# Patient Record
Sex: Male | Born: 1940 | Race: Asian | Hispanic: No | Marital: Married | Smoking: Never smoker
Health system: Southern US, Community
[De-identification: ages and names within clinical notes are randomized; demographics above are authoritative.]

## PROBLEM LIST (undated history)

## (undated) DIAGNOSIS — J449 Chronic obstructive pulmonary disease, unspecified: Secondary | ICD-10-CM

## (undated) DIAGNOSIS — M1990 Unspecified osteoarthritis, unspecified site: Secondary | ICD-10-CM

## (undated) DIAGNOSIS — R196 Halitosis: Secondary | ICD-10-CM

## (undated) DIAGNOSIS — F329 Major depressive disorder, single episode, unspecified: Secondary | ICD-10-CM

## (undated) DIAGNOSIS — E119 Type 2 diabetes mellitus without complications: Secondary | ICD-10-CM

## (undated) DIAGNOSIS — K2970 Gastritis, unspecified, without bleeding: Secondary | ICD-10-CM

## (undated) DIAGNOSIS — K219 Gastro-esophageal reflux disease without esophagitis: Secondary | ICD-10-CM

## (undated) DIAGNOSIS — Z800 Family history of malignant neoplasm of digestive organs: Secondary | ICD-10-CM

## (undated) DIAGNOSIS — K635 Polyp of colon: Secondary | ICD-10-CM

## (undated) DIAGNOSIS — I10 Essential (primary) hypertension: Secondary | ICD-10-CM

## (undated) DIAGNOSIS — F32A Depression, unspecified: Secondary | ICD-10-CM

## (undated) DIAGNOSIS — R1013 Epigastric pain: Secondary | ICD-10-CM

## (undated) DIAGNOSIS — H409 Unspecified glaucoma: Secondary | ICD-10-CM

## (undated) HISTORY — PX: COLONOSCOPY W/ BIOPSIES AND POLYPECTOMY: SHX1376

## (undated) HISTORY — PX: OTHER SURGICAL HISTORY: SHX169

---

## 2005-01-28 ENCOUNTER — Emergency Department: Payer: Self-pay | Admitting: Emergency Medicine

## 2009-07-06 ENCOUNTER — Encounter (INDEPENDENT_AMBULATORY_CARE_PROVIDER_SITE_OTHER): Payer: Self-pay | Admitting: Family Medicine

## 2009-07-06 ENCOUNTER — Ambulatory Visit: Payer: Self-pay | Admitting: Family Medicine

## 2009-07-06 ENCOUNTER — Encounter (INDEPENDENT_AMBULATORY_CARE_PROVIDER_SITE_OTHER): Payer: Self-pay | Admitting: *Deleted

## 2009-07-06 DIAGNOSIS — R030 Elevated blood-pressure reading, without diagnosis of hypertension: Secondary | ICD-10-CM

## 2009-07-06 DIAGNOSIS — M159 Polyosteoarthritis, unspecified: Secondary | ICD-10-CM | POA: Insufficient documentation

## 2009-07-21 ENCOUNTER — Observation Stay: Payer: Self-pay | Admitting: Internal Medicine

## 2010-05-07 NOTE — Miscellaneous (Signed)
Summary: Appointment Canceled  Appointment status changed to canceled by [employer] on 07/06/2009 1:06 PM.  Cancellation Comments --------------------- RESULTS FROM LABWORK INS-ABNORMAL/JBB  Appointment Information ----------------------- Appt Type:  NEW PATIENT      Date:  Monday, July 09, 2009      Time:  1:00 PM for 30 min   Urgency:  Routine   Made By:  Hoy Finlay Scheduler  To Visit:  Jonathan Cantrell    Reason:  RESULTS FROM LABWORK INS-ABNORMAL/[facility]  Appt Comments ------------- -- 07/06/09 13:06: ([facility]) CANCELED -- RESULTS FROM LABWORK INS-ABNORMAL/[facility] -- 07/06/09 12:38: ([facility]) BOOKED -- Routine NEW PATIENT at 07/09/2009 1:00 PM for 30 min RESULTS FROM LABWORK INS-ABNORMAL/[facility]

## 2010-05-07 NOTE — Assessment & Plan Note (Signed)
Summary: INS LABWORK-ABNORMAL/J[facility] exam 2   Vital Signs:  Patient profile:   70 year old male Height:      66 inches Weight:      159 pounds BMI:     25.76 Temp:     98.4 degrees F oral Pulse rate:   74 / minute Pulse rhythm:   regular Resp:     20 per minute BP supine:   158 / 72  (left arm)  Vitals Entered By: Levonne Spiller EMT-P (July 06, 2009 1:04 PM) CC: body aches Is Patient Diabetic? No Pain Assessment Patient in pain? yes     Location: generalized pain in joints Intensity: 3 Type: aching Onset of pain  Gradual History of Present Illness Reason for visit: review labs Chief Complaint: body aches History of Present Illness: patient had blood work performed and was concerned about elevated cholesterol. He reports that his mother suffered 3 strokes prior to her passing. He denies chest pains, headaches, weakness or numbness.   He does complain of stiffness in both arms and fingers. He reports however that he has to split wood for heat at home and has had to do it manually this winter. He has occ pain in his neck.   REVIEW OF SYSTEMS Constitutional Symptoms      Denies fever, chills, night sweats, weight loss, weight gain, and fatigue.    Cardiovascular       Denies chest pain and tires easily with exhertion.    Musculoskeletal       Complains of joint pain and joint stiffness.      Denies muscle pain, decreased range of motion, redness, swelling, muscle weakness, and gout.   Psych       Complains of anxiety/stress. Physical Exam General appearance: well developed, well nourished, no acute distress Head: normocephalic, atraumatic Neck: neck supple,  trachea midline, no masses, nontender to palp Chest/Lungs: no rales, wheezes, or rhonchi bilateral, breath sounds equal without effort Heart: regular rate and  rhythm, no murmur Extremities: normal extremities Neurological: grossly intact and non-focal Skin: no obvious rashes or lesions MSE: oriented to time, place,  and person Nontender epicondyles. no tenderness at the wrists to palp. slight swelling of the joints in the hands.  Assessment New Problems: OSTEOARTHROS INVLV MX SITES BUT NOT SPEC AS GEN (ICD-715.89) ELEVATED BP READING WITHOUT DX HYPERTENSION (ICD-796.2)   Patient Education: Patient and/or caregiver instructed in the following: exercise.  Plan New Medications/Changes: MELOXICAM 7.5 MG TABS (MELOXICAM) 1-2 by mouth daily for pain as needed  #60 x 3, 07/06/2009, Tacey Ruiz MD  New Orders: New Patient Level III [phone] Planning Comments:   Patient given handout on cholesterol and his other labs were discussed. They were all normal.  He was told to return for blood pressure re-check and/or get his blood pressure checked at home.   The patient and/or caregiver has been counseled thoroughly with regard to medications prescribed including dosage, schedule, interactions, rationale for use, and possible side effects and they verbalize understanding.  Diagnoses and expected course of recovery discussed and will return if not improved as expected or if the condition worsens. Patient and/or caregiver verbalized understanding.    Social History: Retired Married Never Smoked Alcohol use-yes Regular exercise-yes Smoking Status:  never Does Patient Exercise:  yes  Prescriptions: MELOXICAM 7.5 MG TABS (MELOXICAM) 1-2 by mouth daily for pain as needed  #60 x 3   Entered and Authorized by:   Tacey Ruiz MD   Signed by:  Tacey Ruiz MD on 07/06/2009   Method used:   Electronically to        [facility]  [address] (retail)       [address]       [place], [place]  [address]       Ph: [phone]       Fax: [phone]   RxID:   [id number]   I have reviewed the chart and have agreed to the contents Standley Dakins MD  July 07, 2009 11:02 AM

## 2010-05-07 NOTE — Letter (Signed)
Summary: Handout Printed  Printed Handout:  - Lipid Profile 

## 2011-06-07 ENCOUNTER — Emergency Department: Payer: Self-pay | Admitting: *Deleted

## 2013-09-21 ENCOUNTER — Ambulatory Visit: Payer: Self-pay | Admitting: Family Medicine

## 2014-08-10 ENCOUNTER — Encounter: Payer: Self-pay | Admitting: *Deleted

## 2014-08-11 ENCOUNTER — Ambulatory Visit: Payer: Commercial Managed Care - HMO | Admitting: Anesthesiology

## 2014-08-11 ENCOUNTER — Encounter: Payer: Self-pay | Admitting: *Deleted

## 2014-08-11 ENCOUNTER — Ambulatory Visit
Admission: RE | Admit: 2014-08-11 | Discharge: 2014-08-11 | Disposition: A | Discharge status: Home / Self Care | Payer: Commercial Managed Care - HMO | Source: Ambulatory Visit | Attending: Gastroenterology | Admitting: Gastroenterology

## 2014-08-11 ENCOUNTER — Encounter
Admission: RE | Disposition: A | Discharge status: Home / Self Care | Payer: Self-pay | Source: Ambulatory Visit | Attending: Gastroenterology

## 2014-08-11 DIAGNOSIS — K2950 Unspecified chronic gastritis without bleeding: Secondary | ICD-10-CM | POA: Diagnosis not present

## 2014-08-11 DIAGNOSIS — Z79899 Other long term (current) drug therapy: Secondary | ICD-10-CM | POA: Insufficient documentation

## 2014-08-11 DIAGNOSIS — Z800 Family history of malignant neoplasm of digestive organs: Secondary | ICD-10-CM | POA: Diagnosis present

## 2014-08-11 DIAGNOSIS — R1013 Epigastric pain: Secondary | ICD-10-CM | POA: Diagnosis not present

## 2014-08-11 DIAGNOSIS — Z86010 Personal history of colonic polyps: Secondary | ICD-10-CM | POA: Diagnosis present

## 2014-08-11 DIAGNOSIS — K621 Rectal polyp: Secondary | ICD-10-CM | POA: Diagnosis not present

## 2014-08-11 DIAGNOSIS — R030 Elevated blood-pressure reading, without diagnosis of hypertension: Secondary | ICD-10-CM

## 2014-08-11 HISTORY — DX: Halitosis: R19.6

## 2014-08-11 HISTORY — PX: ESOPHAGOGASTRODUODENOSCOPY: SHX5428

## 2014-08-11 HISTORY — PX: COLONOSCOPY: SHX5424

## 2014-08-11 HISTORY — DX: Family history of malignant neoplasm of digestive organs: Z80.0

## 2014-08-11 HISTORY — DX: Gastro-esophageal reflux disease without esophagitis: K21.9

## 2014-08-11 HISTORY — DX: Polyp of colon: K63.5

## 2014-08-11 HISTORY — DX: Epigastric pain: R10.13

## 2014-08-11 SURGERY — COLONOSCOPY
Anesthesia: Monitor Anesthesia Care

## 2014-08-11 MED ORDER — FENTANYL CITRATE (PF) 100 MCG/2ML IJ SOLN
INTRAMUSCULAR | Status: DC | PRN
  Administered 2014-08-11: 50 ug via INTRAVENOUS

## 2014-08-11 MED ORDER — SODIUM CHLORIDE 0.9 % IR SOLN
1000.0000 mL | Status: DC
  Administered 2014-08-11: 1000 mL

## 2014-08-11 MED ORDER — LIDOCAINE HCL (CARDIAC) 20 MG/ML IV SOLN
INTRAVENOUS | Status: DC | PRN
  Administered 2014-08-11: 50 mg via INTRAVENOUS

## 2014-08-11 MED ORDER — SODIUM CHLORIDE 0.9 % IV SOLN
INTRAVENOUS | Status: DC
  Administered 2014-08-11: 09:00:00 via INTRAVENOUS

## 2014-08-11 MED ORDER — PROPOFOL INFUSION 10 MG/ML OPTIME
INTRAVENOUS | Status: DC | PRN
  Administered 2014-08-11: 100 ug/kg/min via INTRAVENOUS

## 2014-08-11 MED ORDER — GLYCOPYRROLATE 0.2 MG/ML IJ SOLN
INTRAMUSCULAR | Status: DC | PRN
  Administered 2014-08-11: 0.2 mg via INTRAVENOUS

## 2014-08-11 MED ORDER — PROPOFOL 10 MG/ML IV BOLUS
INTRAVENOUS | Status: DC | PRN
  Administered 2014-08-11: 1000 ug via INTRAVENOUS

## 2014-08-11 MED ORDER — EPHEDRINE SULFATE 50 MG/ML IJ SOLN
INTRAMUSCULAR | Status: DC | PRN
  Administered 2014-08-11: 20 mg via INTRAVENOUS

## 2014-08-11 NOTE — Op Note (Signed)
[facility] Gastroenterology Patient Name: Jonathan Cantrell Procedure Date: 08/11/2014 9:13 AM MRN: [id number] Account #: [id number] Date of Birth: 01/27/1941 Admit Type: Outpatient Age: 74 [facility] 4 Gender: Male Note Status: Finalized Procedure:         Colonoscopy Indications:       Family history of colon cancer in a first-degree relative,                     Personal history of colonic polyps Providers:         Ezzard StandingPaul Y. Bluford Kaufmannh, MD Referring MD:      Sallye LatNo Local Md, MD (Referring MD) Medicines:         Monitored Anesthesia Care Complications:     No immediate complications. Procedure:         Pre-Anesthesia Assessment:                    - Prior to the procedure, a History and Physical was                     performed, and patient medications, allergies and                     sensitivities were reviewed. The patient's tolerance of                     previous anesthesia was reviewed.                    - The risks and benefits of the procedure and the sedation                     options and risks were discussed with the patient. All                     questions were answered and informed consent was obtained.                    - After reviewing the risks and benefits, the patient was                     deemed in satisfactory condition to undergo the procedure.                    After obtaining informed consent, the colonoscope was                     passed under direct vision. Throughout the procedure, the                     patient's blood pressure, pulse, and oxygen saturations                     were monitored continuously. The Colonoscope was                     introduced through the anus and advanced to the the cecum,                     identified by appendiceal orifice and ileocecal valve. The                     colonoscopy was performed without difficulty. The patient  tolerated the procedure well. The quality of the bowel                  preparation was fair. Findings:      A small polyp was found in the rectum. The polyp was sessile. The polyp       was removed with a jumbo cold forceps. Resection and retrieval were       complete.      The exam was otherwise without abnormality. Impression:        - One small polyp in the rectum. Resected and retrieved.                    - The examination was otherwise normal. Recommendation:    - Discharge patient to home.                    - Await pathology results.                    - The findings and recommendations were discussed with the                     patient. Procedure Code(s): --- Professional ---                    [id number], Colonoscopy, flexible; with biopsy, single or                     multiple Diagnosis Code(s): --- Professional ---                    K62.1, Rectal polyp                    Z80.0, Family history of malignant neoplasm of digestive                     organs                    Z86.010, Personal history of colonic polyps CPT copyright 2014 American Medical Association. All rights reserved. The codes documented in this report are preliminary and upon coder review may  be revised to meet current compliance requirements. Wallace CullensPaul Y Leatta Alewine, MD 08/11/2014 9:46:38 AM This report has been signed electronically. Number of Addenda: 0 Note Initiated On: 08/11/2014 9:13 AM Scope Withdrawal Time: 0 hours 6 minutes 44 seconds  Total Procedure Duration: 0 hours 8 minutes 40 seconds       [facility]

## 2014-08-11 NOTE — Anesthesia Postprocedure Evaluation (Signed)
  Anesthesia Post-op Note  Patient: Jonathan Cantrell  Procedure(s) Performed: Procedure(s): COLONOSCOPY (N/A) ESOPHAGOGASTRODUODENOSCOPY (EGD) (N/A)  Anesthesia type:MAC  Patient location: PACU  Post pain: Pain level controlled  Post assessment: Post-op Vital signs reviewed, Patient's Cardiovascular Status Stable, Respiratory Function Stable, Patent Airway and No signs of Nausea or vomiting  Post vital signs: Reviewed and stable  Last Vitals:  Filed Vitals:   08/11/14 0947  BP: 105/57  Pulse: 72  Temp: 36.2 C  Resp: 14    Level of consciousness: awake, alert  and patient cooperative  Complications: No apparent anesthesia complications

## 2014-08-11 NOTE — H&P (Signed)
Date of Initial H&P: 07/25/2014 History reviewed, patient examined, no change in status, stable for surgery. 

## 2014-08-11 NOTE — Anesthesia Preprocedure Evaluation (Signed)
Anesthesia Evaluation  Patient identified by MRN, date of birth, ID band Patient awake    Reviewed: Allergy & Precautions, NPO status , Patient's Chart, lab work & pertinent test results, reviewed documented beta blocker date and time   Airway Mallampati: II  TM Distance: >3 FB Neck ROM: Full    Dental  (+) Chipped   Pulmonary          Cardiovascular     Neuro/Psych    GI/Hepatic GERD-  ,  Endo/Other    Renal/GU      Musculoskeletal  (+) Arthritis -,   Abdominal   Peds  Hematology   Anesthesia Other Findings   Reproductive/Obstetrics                             Anesthesia Physical Anesthesia Plan  ASA: II  Anesthesia Plan: MAC   Post-op Pain Management:    Induction: Intravenous  Airway Management Planned: Nasal Cannula  Additional Equipment:   Intra-op Plan:   Post-operative Plan:   Informed Consent: I have reviewed the patients History and Physical, chart, labs and discussed the procedure including the risks, benefits and alternatives for the proposed anesthesia with the patient or authorized representative who has indicated his/her understanding and acceptance.     Plan Discussed with: CRNA and Surgeon  Anesthesia Plan Comments:         Anesthesia Quick Evaluation

## 2014-08-11 NOTE — Transfer of Care (Signed)
Immediate Anesthesia Transfer of Care Note  Patient: Jonathan Cantrell  Procedure(s) Performed: Procedure(s): COLONOSCOPY (N/A) ESOPHAGOGASTRODUODENOSCOPY (EGD) (N/A)  Patient Location: PACU/endo  Anesthesia Type:MAC  Level of Consciousness: Alert, Awake, Oriented  Airway & Oxygen Therapy: Patient Spontanous Breathing  Post-op Assessment: Report given to RN  Post vital signs: Reviewed and stable  Last Vitals:  Filed Vitals:   08/11/14 0947  BP: 105/57  Pulse: 72  Temp: 36.2 C  Resp: 14    Complications: No apparent anesthesia complications

## 2014-08-11 NOTE — Op Note (Signed)
[facility] Gastroenterology Patient Name: Jonathan Cantrell Procedure Date: 08/11/2014 9:16 AM MRN: [id number] Account #: [id number] Date of Birth: 04/16/1940 Admit Type: Outpatient Age: 74 [facility] Gender: Male Note Status: Finalized Procedure:         Upper GI endoscopy Indications:       Dyspepsia, Suspected esophageal reflux Providers:         Ezzard StandingPaul Y. Bluford Kaufmannh, MD Referring MD:      Sallye LatNo Local Md, MD (Referring MD) Medicines:         Monitored Anesthesia Care Complications:     No immediate complications. Procedure:         Pre-Anesthesia Assessment:                    - Prior to the procedure, a History and Physical was                     performed, and patient medications, allergies and                     sensitivities were reviewed. The patient's tolerance of                     previous anesthesia was reviewed.                    - The risks and benefits of the procedure and the sedation                     options and risks were discussed with the patient. All                     questions were answered and informed consent was obtained.                    - After reviewing the risks and benefits, the patient was                     deemed in satisfactory condition to undergo the procedure.                    After obtaining informed consent, the endoscope was passed                     under direct vision. Throughout the procedure, the                     patient's blood pressure, pulse, and oxygen saturations                     were monitored continuously. The Olympus GIF-160 endoscope                     (S#. [id number]) was introduced through the mouth, and                     advanced to the second part of duodenum. The upper GI                     endoscopy was accomplished without difficulty. The patient                     tolerated the procedure well. Findings:      The examined esophagus was normal. Biopsies were taken with  a cold       forceps for  histology.      The entire examined stomach was normal. Biopsies were taken with a cold       forceps for Helicobacter pylori testing.      The examined duodenum was normal. Impression:        - Normal esophagus. Biopsied.                    - Normal stomach. Biopsied.                    - Normal examined duodenum. Recommendation:    - Discharge patient to home.                    - Observe patient's clinical course.                    - Await pathology results.                    - The findings and recommendations were discussed with the                     patient. Procedure Code(s): --- Professional ---                    [phone], Esophagogastroduodenoscopy, flexible, transoral;                     with biopsy, single or multiple Diagnosis Code(s): --- Professional ---                    K30, Functional dyspepsia CPT copyright 2014 American Medical Association. All rights reserved. The codes documented in this report are preliminary and upon coder review may  be revised to meet current compliance requirements. Wallace CullensPaul Y Hendrix Yurkovich, MD 08/11/2014 9:33:35 AM This report has been signed electronically. Number of Addenda: 0 Note Initiated On: 08/11/2014 9:16 AM      [facility]

## 2014-08-14 LAB — SURGICAL PATHOLOGY

## 2015-05-15 ENCOUNTER — Encounter: Payer: Self-pay | Admitting: Emergency Medicine

## 2015-05-15 ENCOUNTER — Emergency Department
Admission: EM | Admit: 2015-05-15 | Discharge: 2015-05-15 | Disposition: A | Discharge status: Home / Self Care | Payer: Medicare HMO | Attending: Emergency Medicine | Admitting: Emergency Medicine

## 2015-05-15 DIAGNOSIS — Z79899 Other long term (current) drug therapy: Secondary | ICD-10-CM | POA: Insufficient documentation

## 2015-05-15 DIAGNOSIS — R1010 Upper abdominal pain, unspecified: Secondary | ICD-10-CM | POA: Insufficient documentation

## 2015-05-15 DIAGNOSIS — R110 Nausea: Secondary | ICD-10-CM | POA: Insufficient documentation

## 2015-05-15 DIAGNOSIS — R197 Diarrhea, unspecified: Secondary | ICD-10-CM | POA: Diagnosis not present

## 2015-05-15 DIAGNOSIS — Z791 Long term (current) use of non-steroidal anti-inflammatories (NSAID): Secondary | ICD-10-CM | POA: Diagnosis not present

## 2015-05-15 LAB — URINALYSIS COMPLETE WITH MICROSCOPIC (ARMC ONLY)
Bilirubin Urine: NEGATIVE
GLUCOSE, UA: NEGATIVE mg/dL
HGB URINE DIPSTICK: NEGATIVE
Leukocytes, UA: NEGATIVE
Nitrite: NEGATIVE
Protein, ur: NEGATIVE mg/dL
SPECIFIC GRAVITY, URINE: 1.026 (ref 1.005–1.030)
pH: 5 (ref 5.0–8.0)

## 2015-05-15 LAB — COMPREHENSIVE METABOLIC PANEL
ALT: 13 U/L — ABNORMAL LOW (ref 17–63)
AST: 20 U/L (ref 15–41)
Albumin: 4.2 g/dL (ref 3.5–5.0)
Alkaline Phosphatase: 55 U/L (ref 38–126)
Anion gap: 7 (ref 5–15)
BUN: 24 mg/dL — AB (ref 6–20)
CHLORIDE: 110 mmol/L (ref 101–111)
CO2: 21 mmol/L — AB (ref 22–32)
CREATININE: 0.71 mg/dL (ref 0.61–1.24)
Calcium: 8.4 mg/dL — ABNORMAL LOW (ref 8.9–10.3)
GFR calc Af Amer: >60 mL/min (ref >60)
GFR calc non Af Amer: >60 mL/min (ref >60)
Glucose, Bld: 97 mg/dL (ref 65–99)
POTASSIUM: 3.7 mmol/L (ref 3.5–5.1)
Sodium: 138 mmol/L (ref 135–145)
Total Bilirubin: 1.8 mg/dL — ABNORMAL HIGH (ref 0.3–1.2)
Total Protein: 6.8 g/dL (ref 6.5–8.1)

## 2015-05-15 LAB — CBC
HEMATOCRIT: 45.8 % (ref 40.0–52.0)
Hemoglobin: 15.2 g/dL (ref 13.0–18.0)
MCH: 29.5 pg (ref 26.0–34.0)
MCHC: 33.2 g/dL (ref 32.0–36.0)
MCV: 88.8 fL (ref 80.0–100.0)
PLATELETS: 191 10*3/uL (ref 150–440)
RBC: 5.16 MIL/uL (ref 4.40–5.90)
RDW: 14 % (ref 11.5–14.5)
WBC: 6.2 10*3/uL (ref 3.8–10.6)

## 2015-05-15 LAB — LIPASE, BLOOD: LIPASE: 25 U/L (ref 11–51)

## 2015-05-15 MED ORDER — SODIUM CHLORIDE 0.9 % IV BOLUS (SEPSIS): 1000.0000 mL | Freq: Once | INTRAVENOUS | Status: DC

## 2015-05-15 MED ORDER — LOPERAMIDE HCL 2 MG PO CAPS
4.0000 mg | ORAL_CAPSULE | Freq: Once | ORAL | Status: DC
  Filled 2015-05-15: qty 2

## 2015-05-15 NOTE — Discharge Instructions (Signed)
Take loperamide 2 mg after each loose stool, but do not take more than the recommended level on the packaging.  You examine evaluation are reassuring in the emergency department. Return to the emergency department for any worsening pain, black or bloody stool, concern for dehydration such as dry mouth or not making urine, fever, or any other symptoms concerning to you.   Diarrhea Diarrhea is frequent loose and watery bowel movements. It can cause you to feel weak and dehydrated. Dehydration can cause you to become tired and thirsty, have a dry mouth, and have decreased urination that often is dark yellow. Diarrhea is a sign of another problem, most often [place] infection that will not last long. In most cases, diarrhea typically lasts 2-3 days. However, it can last longer if it is a sign of something more serious. It is important to treat your diarrhea as directed by your caregiver to lessen or prevent future episodes of diarrhea. CAUSES  Some common causes include:  Gastrointestinal infections caused by viruses, bacteria, or parasites.  Food poisoning or food allergies.  Certain medicines, such as antibiotics, chemotherapy, and laxatives.  Artificial sweeteners and fructose.  Digestive disorders. HOME CARE INSTRUCTIONS  Ensure adequate fluid intake (hydration): Have 1 cup (8 oz) of fluid for each diarrhea episode. Avoid fluids that contain simple sugars or sports drinks, fruit juices, whole milk products, and sodas. Your urine should be clear or pale yellow if you are drinking enough fluids. Hydrate with [place] oral rehydration solution that you can purchase at pharmacies, retail stores, and online. You can prepare [place] oral rehydration solution at home by mixing the following ingredients together:   - tsp table salt.   tsp baking soda.   tsp salt substitute containing potassium chloride.  1  tablespoons sugar.  1 L (34 oz) of water.  Certain foods and beverages may increase the speed at  which food moves through the gastrointestinal (GI) tract. These foods and beverages should be avoided and include:  Caffeinated and alcoholic beverages.  High-fiber foods, such as raw fruits and vegetables, nuts, seeds, and whole grain breads and cereals.  Foods and beverages sweetened with sugar alcohols, such as xylitol, sorbitol, and mannitol.  Some foods may be well tolerated and may help thicken stool including:  Starchy foods, such as rice, toast, pasta, low-sugar cereal, oatmeal, grits, baked potatoes, crackers, and bagels.  Bananas.  Applesauce.  Add probiotic-rich foods to help increase healthy bacteria in the GI tract, such as yogurt and fermented milk products.  Wash your hands well after each diarrhea episode.  Only take over-the-counter or prescription medicines as directed by your caregiver.  Take a warm bath to relieve any burning or pain from frequent diarrhea episodes. SEEK IMMEDIATE MEDICAL CARE IF:   You are unable to keep fluids down.  You have persistent vomiting.  You have blood in your stool, or your stools are black and tarry.  You do not urinate in 6-8 hours, or there is only a small amount of very dark urine.  You have abdominal pain that increases or localizes.  You have weakness, dizziness, confusion, or light-headedness.  You have a severe headache.  Your diarrhea gets worse or does not get better.  You have a fever or persistent symptoms for more than 2-3 days.  You have a fever and your symptoms suddenly get worse. MAKE SURE YOU:   Understand these instructions.  Will watch your condition.  Will get help right away if you are not doing well  or get worse.   This information is not intended to replace advice given to you by your health care provider. Make sure you discuss any questions you have with your health care provider.   Document Released: 03/14/2002 Document Revised: 04/14/2014 Document Reviewed: 11/30/2011 Elsevier  Interactive Patient Education [employer]

## 2015-05-15 NOTE — ED Notes (Signed)
Pt to ed with c/o diarrhea today.  Denies vomiting.  Pt states he tried otc meds without relief.

## 2015-05-15 NOTE — ED Provider Notes (Signed)
[facility] Emergency Department Provider Note   ____________________________________________  Time seen: Approximately 5:35 PM I have reviewed the triage vital signs and the triage nursing note.  HISTORY  Chief Complaint Diarrhea   Historian Patient  HPI Jonathan Cantrell is a 75 y.o. male who reports that he exerted quite a bit yesterday since there is nice weather he mowed the lawn split firewood and then had 2 beers. He woke up this morning around 1 AM and felt some sharp few pains in his stomach and went back to sleep. He woke up around 6 AM and had a little bit of nausea when he ate water. Throughout the day he's had about 7 episodes of watery diarrhea. Just before the diarrhea he would have a strong upper abdominal cramping. No black or bloody stools. No fever. Nausea but without vomiting. He had a hamburger last night but does not suspects food poisoning. No known sick contacts. He took 4 mg of loperamide at home.  Pain at worst was 8 out of 10 currently no pain. He's feeling better now.    Past Medical History  Diagnosis Date  . Colon polyps     personal hx of  . Dyspepsia   . Halitosis   . GERD (gastroesophageal reflux disease)   . Family history of colon cancer     Patient Active Problem List   Diagnosis Date Noted  . OSTEOARTHROS INVLV MX SITES BUT NOT SPEC AS GEN 07/06/2009  . ELEVATED BP READING WITHOUT DX HYPERTENSION 07/06/2009    Past Surgical History  Procedure Laterality Date  . Colonoscopy N/A 08/11/2014    Procedure: COLONOSCOPY;  Surgeon: Wallace Cullens, MD;  Location: [facility] ENDOSCOPY;  Service: Gastroenterology;  Laterality: N/A;  . Esophagogastroduodenoscopy N/A 08/11/2014    Procedure: ESOPHAGOGASTRODUODENOSCOPY (EGD);  Surgeon: Wallace Cullens, MD;  Location: [facility] ENDOSCOPY;  Service: Gastroenterology;  Laterality: N/A;    Current Outpatient Rx  Name  Route  Sig  Dispense  Refill  . bismuth subsalicylate (PEPTO BISMOL) 262 MG chewable tablet    Oral   Chew 524 mg by mouth as needed.         . Brinzolamide-Brimonidine 1-0.2 % SUSP   Ophthalmic   Apply 1 drop to eye 2 (two) times daily.         . cholecalciferol (VITAMIN D) 400 UNITS TABS tablet   Oral   Take 400 Units by mouth 2 (two) times daily.         Marland Kitchen dexlansoprazole (DEXILANT) 60 MG capsule   Oral   Take 60 mg by mouth daily.         . meloxicam (MOBIC) 15 MG tablet   Oral   Take 15 mg by mouth daily.         . vitamin B-12 (CYANOCOBALAMIN) 1000 MCG tablet   Oral   Take 1,000 mcg by mouth daily.           Allergies Review of patient's allergies indicates no known allergies.  History reviewed. No pertinent family history.  Social History Social History  Substance Use Topics  . Smoking status: Never Smoker   . Smokeless tobacco: None  . Alcohol Use: Yes    Review of Systems  Constitutional: Negative for fever. Eyes: Negative for visual changes. ENT: Negative for sore throat. Cardiovascular: Negative for chest pain. Respiratory: Negative for shortness of breath. Gastrointestinal: As per history of present illness Genitourinary: Negative for dysuria. Musculoskeletal: Negative for back pain. Skin: Negative  for rash. Neurological: Negative for headache. 10 point Review of Systems otherwise negative ____________________________________________   PHYSICAL EXAM:  VITAL SIGNS: ED Triage Vitals  Enc Vitals Group     BP 05/15/15 1600 150/87 mmHg     Pulse Rate 05/15/15 1600 85     Resp 05/15/15 1600 20     Temp 05/15/15 1600 98.3 F (36.8 C)     Temp Source 05/15/15 1600 Oral     SpO2 05/15/15 1600 97 %     Weight 05/15/15 1600 161 lb (73.029 kg)     Height 05/15/15 1600  (1.626 m)     Head Cir --      Peak Flow --      Pain Score 05/15/15 1601 0     Pain Loc --      Pain Edu? --      Excl. in GC? --      Constitutional: Alert and oriented. Well appearing and in no distress. HEENT   Head: Normocephalic and  atraumatic.      Eyes: Conjunctivae are normal. PERRL. Normal extraocular movements.      Ears:         Nose: No congestion/rhinnorhea.   Mouth/Throat: Mucous membranes are moist.   Neck: No stridor. Cardiovascular/Chest: Normal rate, regular rhythm.  No murmurs, rubs, or gallops. Respiratory: Normal respiratory effort without tachypnea nor retractions. Breath sounds are clear and equal bilaterally. No wheezes/rales/rhonchi. Gastrointestinal: Soft. No distention, no guarding, no rebound. Nontender.    Genitourinary/rectal:Deferred Musculoskeletal: Nontender with normal range of motion in all extremities. No joint effusions.  No lower extremity tenderness.  No edema. Neurologic:  Normal speech and language. No gross or focal neurologic deficits are appreciated. Skin:  Skin is warm, dry and intact. No rash noted. Psychiatric: Mood and affect are normal. Speech and behavior are normal. Patient exhibits appropriate insight and judgment.  ____________________________________________   EKG I, Governor Rooks, MD, the attending physician have personally viewed and interpreted all ECGs.  None ____________________________________________  LABS (pertinent positives/negatives)  Urinalysis 1+ ketones, rare bacteria, negative for nitrites and leukocytes Lipase 25 Comprehensive metabolic panel without significant abnormality White blood count 6.2, hemoglobin 15.2 and platelet count 191  ____________________________________________  RADIOLOGY All Xrays were viewed by me. Imaging interpreted by Radiologist.  None __________________________________________  PROCEDURES  Procedure(s) performed: None  Critical Care performed: None  ____________________________________________   ED COURSE / ASSESSMENT AND PLAN  Pertinent labs & imaging results that were available during my care of the patient were reviewed by me and considered in my medical decision making (see chart for  details).    Patient had acute diarrhea which is watery, but he is now much improved. Abdomen is soft and nontender now. No fever. Laboratory exam is reassuring as well as his clinical exam.  We discussed that I don't recommend imaging at this point in time he feels comfortable with going home.    CONSULTATIONS:   None   Patient / Family / Caregiver informed of clinical course, medical decision-making process, and agree with plan.   I discussed return precautions, follow-up instructions, and discharged instructions with patient and/or family.   ___________________________________________   FINAL CLINICAL IMPRESSION(S) / ED DIAGNOSES   Final diagnoses:  Diarrhea, unspecified type              Note: This dictation was prepared with Dragon dictation. Any transcriptional errors that result from this process are unintentional   Governor Rooks, MD 05/15/15 1759

## 2016-11-29 ENCOUNTER — Encounter: Payer: Self-pay | Admitting: Emergency Medicine

## 2016-11-29 ENCOUNTER — Emergency Department
Admission: EM | Admit: 2016-11-29 | Discharge: 2016-11-29 | Disposition: A | Discharge status: Home / Self Care | Payer: Medicare HMO | Attending: Emergency Medicine | Admitting: Emergency Medicine

## 2016-11-29 ENCOUNTER — Emergency Department: Payer: Medicare HMO

## 2016-11-29 DIAGNOSIS — S6991XA Unspecified injury of right wrist, hand and finger(s), initial encounter: Secondary | ICD-10-CM | POA: Diagnosis present

## 2016-11-29 DIAGNOSIS — S52551A Other extraarticular fracture of lower end of right radius, initial encounter for closed fracture: Secondary | ICD-10-CM

## 2016-11-29 DIAGNOSIS — Z79899 Other long term (current) drug therapy: Secondary | ICD-10-CM | POA: Insufficient documentation

## 2016-11-29 DIAGNOSIS — W010XXA Fall on same level from slipping, tripping and stumbling without subsequent striking against object, initial encounter: Secondary | ICD-10-CM | POA: Insufficient documentation

## 2016-11-29 DIAGNOSIS — Y92007 Garden or yard of unspecified non-institutional (private) residence as the place of occurrence of the external cause: Secondary | ICD-10-CM | POA: Insufficient documentation

## 2016-11-29 DIAGNOSIS — Y93H2 Activity, gardening and landscaping: Secondary | ICD-10-CM | POA: Insufficient documentation

## 2016-11-29 DIAGNOSIS — S52514A Nondisplaced fracture of right radial styloid process, initial encounter for closed fracture: Secondary | ICD-10-CM | POA: Diagnosis not present

## 2016-11-29 DIAGNOSIS — Z791 Long term (current) use of non-steroidal anti-inflammatories (NSAID): Secondary | ICD-10-CM | POA: Insufficient documentation

## 2016-11-29 DIAGNOSIS — Y999 Unspecified external cause status: Secondary | ICD-10-CM | POA: Insufficient documentation

## 2016-11-29 MED ORDER — OXYCODONE-ACETAMINOPHEN 5-325 MG PO TABS: 1.0000 | ORAL_TABLET | Freq: Four times a day (QID) | ORAL | 0 refills | Status: AC | PRN

## 2016-11-29 MED ORDER — OXYCODONE-ACETAMINOPHEN 5-325 MG PO TABS: 1.0000 | ORAL_TABLET | Freq: Once | ORAL | Status: DC

## 2016-11-29 MED ORDER — IBUPROFEN 600 MG PO TABS: 600.0000 mg | ORAL_TABLET | Freq: Once | ORAL | Status: DC

## 2016-11-29 NOTE — ED Triage Notes (Signed)
States fell about 2 hours ago, pain R hand and wrist.

## 2016-11-29 NOTE — ED Provider Notes (Signed)
[facility] Emergency Department Provider Note  ____________________________________________   I have reviewed the triage vital signs and the nursing notes.   HISTORY  Chief Complaint Wrist Pain    HPI Jonathan Cantrell is a 76 y.o. male who fellin the garden today. Non-syncopal fall. Levin on outstretched hands. Did not hit his head, no neck pain or loss of consciousness or. No hip pain. Able to walk. No other complaints except for pain to his right wrist. No deformity. No numbness no weakness.    Past Medical History:  Diagnosis Date  . Colon polyps    personal hx of  . Dyspepsia   . Family history of colon cancer   . GERD (gastroesophageal reflux disease)   . Halitosis     Patient Active Problem List   Diagnosis Date Noted  . OSTEOARTHROS INVLV MX SITES BUT NOT SPEC AS GEN 07/06/2009  . ELEVATED BP READING WITHOUT DX HYPERTENSION 07/06/2009    Past Surgical History:  Procedure Laterality Date  . COLONOSCOPY N/A 08/11/2014   Procedure: COLONOSCOPY;  Surgeon: Wallace Cullens, MD;  Location: [facility] ENDOSCOPY;  Service: Gastroenterology;  Laterality: N/A;  . ESOPHAGOGASTRODUODENOSCOPY N/A 08/11/2014   Procedure: ESOPHAGOGASTRODUODENOSCOPY (EGD);  Surgeon: Wallace Cullens, MD;  Location: [facility] ENDOSCOPY;  Service: Gastroenterology;  Laterality: N/A;    Prior to Admission medications   Medication Sig Start Date End Date Taking? Authorizing Provider  bismuth subsalicylate (PEPTO BISMOL) 262 MG chewable tablet Chew 524 mg by mouth as needed.    [provider]  Brinzolamide-Brimonidine 1-0.2 % SUSP Apply 1 drop to eye 2 (two) times daily.    [provider]  cholecalciferol (VITAMIN D) 400 UNITS TABS tablet Take 400 Units by mouth 2 (two) times daily.    [provider]  dexlansoprazole (DEXILANT) 60 MG capsule Take 60 mg by mouth daily.    [provider]  meloxicam (MOBIC) 15 MG tablet Take 15 mg by mouth daily.    [provider]   vitamin B-12 (CYANOCOBALAMIN) 1000 MCG tablet Take 1,000 mcg by mouth daily.    [provider]    Allergies Patient has no known allergies.  No family history on file.  Social History Social History  Substance Use Topics  . Smoking status: Never Smoker  . Smokeless tobacco: Not on file  . Alcohol use Yes    Review of Systems    ____________________________________________   PHYSICAL EXAM:  VITAL SIGNS: ED Triage Vitals  Enc Vitals Group     BP 11/29/16 1503 (!) 146/68     Pulse Rate 11/29/16 1503 73     Resp 11/29/16 1503 18     Temp 11/29/16 1503 98.4 F (36.9 C)     Temp Source 11/29/16 1503 Oral     SpO2 11/29/16 1503 98 %     Weight 11/29/16 1504 150 lb (68 kg)     Height 11/29/16 1504 5\' 7"  (1.702 m)     Head Circumference --      Peak Flow --      Pain Score 11/29/16 1502 8     Pain Loc --      Pain Edu? --      Excl. in GC? --     Constitutional: Alert and oriented. Well appearing and in no acute distress. Eyes: Conjunctivae are normal Head: Atraumatic HEENT: No congestion/rhinnorhea. Mucous membranes are moist.  Oropharynx non-erythematous Neck:   Nontender with no meningismus, no masses, no stridor Cardiovascular: Normal  rate, regular rhythm. Grossly normal heart sounds.  Good peripheral circulation. Respiratory: Normal respiratory effort.  No retractions. Lungs CTAB. Abdominal: Soft and nontender. No distention. No guarding no rebound Back:  There is no focal tenderness or step off.  there is no midline tenderness there are no lesions noted. there is no CVA tenderness Musculoskeletal: No lower extremity tenderness,he right wrist is very slightly swollen with tenderness to palpation especially on the radial aspect, however they're joint itself has full range of motion, and he is neurovascularly intact distally with strong radial and ulnar pulses, and medial ulnar and radial nerve areas are intact muscle strength and sensation.. No joint  effusions, no DVT signs strong distal pulses no edema Neurologic:  Normal speech and language. No gross focal neurologic deficits are appreciated.  Skin:  Skin is warm, dry and intact. No rash noted. Psychiatric: Mood and affect are normal. Speech and behavior are normal.  ____________________________________________   LABS (all labs ordered are listed, but only abnormal results are displayed)  Labs Reviewed - No data to display ____________________________________________  EKG   ____________________________________________  RADIOLOGY  I reviewed any imaging ordered by me or triage that were performed during my shift and, if possible, patient and/or family made aware of any abnormal findings. ____________________________________________   PROCEDURES  Procedure(s) performed: None  Procedures  Critical Care performed: None  ____________________________________________   INITIAL IMPRESSION / ASSESSMENT AND PLAN / ED COURSE  Pertinent labs & imaging results that were available during my care of the patient were reviewed by me and considered in my medical decision making (see chart for details).  Patient with a nonsurgical fall, question nondisplacedcrack in the distal radial styloid. Discussed with Dr. Hyacinth Meeker, he agrees tWill splint and follow-up. Patient is driving, I cannot give him narcotic pain medication here. We will give him analgesia.  ----------------------------------------- 5:07 PM on 11/29/2016 -----------------------------------------  Neurovascular intact after splint placement, return precautions and follow given and understood.No other injury noted.   ____________________________________________   FINAL CLINICAL IMPRESSION(S) / ED DIAGNOSES  Final diagnoses:  None      This chart was dictated using voice recognition software.  Despite best efforts to proofread,  errors can occur which can change meaning.      Jeanmarie Plant, MD 11/29/16  [phone]

## 2018-05-26 ENCOUNTER — Other Ambulatory Visit: Payer: Self-pay | Admitting: Pulmonary Disease

## 2018-05-26 DIAGNOSIS — R05 Cough: Secondary | ICD-10-CM

## 2018-05-26 DIAGNOSIS — R053 Chronic cough: Secondary | ICD-10-CM

## 2018-06-11 ENCOUNTER — Encounter: Payer: Self-pay | Admitting: *Deleted

## 2018-06-11 ENCOUNTER — Other Ambulatory Visit: Payer: Self-pay

## 2018-06-11 ENCOUNTER — Ambulatory Visit: Payer: Medicare HMO | Attending: Pulmonary Disease

## 2018-06-11 DIAGNOSIS — R05 Cough: Secondary | ICD-10-CM | POA: Insufficient documentation

## 2018-06-11 DIAGNOSIS — R053 Chronic cough: Secondary | ICD-10-CM

## 2018-06-11 MED ORDER — METHACHOLINE 0.0625 MG/ML NEB SOLN
2.0000 mL | Freq: Once | RESPIRATORY_TRACT | Status: AC
  Administered 2018-06-11: 0.125 mg via RESPIRATORY_TRACT
  Filled 2018-06-11: qty 2

## 2018-06-11 MED ORDER — METHACHOLINE 4 MG/ML NEB SOLN
2.0000 mL | Freq: Once | RESPIRATORY_TRACT | Status: AC
  Administered 2018-06-11: 8 mg via RESPIRATORY_TRACT
  Filled 2018-06-11: qty 2

## 2018-06-11 MED ORDER — METHACHOLINE 1 MG/ML NEB SOLN
2.0000 mL | Freq: Once | RESPIRATORY_TRACT | Status: AC
  Administered 2018-06-11: 2 mg via RESPIRATORY_TRACT
  Filled 2018-06-11: qty 2

## 2018-06-11 MED ORDER — SODIUM CHLORIDE 0.9 % IN NEBU
3.0000 mL | INHALATION_SOLUTION | Freq: Once | RESPIRATORY_TRACT | Status: AC
Start: 2018-06-11 — End: 2018-06-11
  Administered 2018-06-11: 3 mL via RESPIRATORY_TRACT

## 2018-06-11 MED ORDER — METHACHOLINE 0.25 MG/ML NEB SOLN
2.0000 mL | Freq: Once | RESPIRATORY_TRACT | Status: AC
  Administered 2018-06-11: 0.5 mg via RESPIRATORY_TRACT
  Filled 2018-06-11: qty 2

## 2018-06-11 MED ORDER — ALBUTEROL SULFATE (2.5 MG/3ML) 0.083% IN NEBU
2.5000 mg | INHALATION_SOLUTION | Freq: Once | RESPIRATORY_TRACT | Status: AC
  Administered 2018-06-11: 2.5 mg via RESPIRATORY_TRACT
  Filled 2018-06-11: qty 3

## 2018-06-11 MED ORDER — METHACHOLINE 16 MG/ML NEB SOLN
2.0000 mL | Freq: Once | RESPIRATORY_TRACT | Status: AC
  Administered 2018-06-11: 32 mg via RESPIRATORY_TRACT
  Filled 2018-06-11: qty 2

## 2018-06-14 ENCOUNTER — Ambulatory Visit: Payer: Medicare HMO | Admitting: Certified Registered"

## 2018-06-14 ENCOUNTER — Encounter: Payer: Self-pay | Admitting: Anesthesiology

## 2018-06-14 ENCOUNTER — Other Ambulatory Visit: Payer: Self-pay

## 2018-06-14 ENCOUNTER — Encounter
Admission: RE | Disposition: A | Discharge status: Home / Self Care | Payer: Self-pay | Source: Home / Self Care | Attending: Unknown Physician Specialty

## 2018-06-14 ENCOUNTER — Ambulatory Visit
Admission: RE | Admit: 2018-06-14 | Discharge: 2018-06-14 | Disposition: A | Discharge status: Home / Self Care | Payer: Medicare HMO | Attending: Unknown Physician Specialty | Admitting: Unknown Physician Specialty

## 2018-06-14 DIAGNOSIS — R12 Heartburn: Secondary | ICD-10-CM | POA: Diagnosis not present

## 2018-06-14 DIAGNOSIS — H409 Unspecified glaucoma: Secondary | ICD-10-CM | POA: Insufficient documentation

## 2018-06-14 DIAGNOSIS — Z86010 Personal history of colonic polyps: Secondary | ICD-10-CM | POA: Insufficient documentation

## 2018-06-14 DIAGNOSIS — I10 Essential (primary) hypertension: Secondary | ICD-10-CM | POA: Diagnosis not present

## 2018-06-14 DIAGNOSIS — R1084 Generalized abdominal pain: Secondary | ICD-10-CM | POA: Insufficient documentation

## 2018-06-14 DIAGNOSIS — Z800 Family history of malignant neoplasm of digestive organs: Secondary | ICD-10-CM | POA: Diagnosis not present

## 2018-06-14 DIAGNOSIS — K640 First degree hemorrhoids: Secondary | ICD-10-CM | POA: Diagnosis not present

## 2018-06-14 DIAGNOSIS — K5900 Constipation, unspecified: Secondary | ICD-10-CM | POA: Diagnosis present

## 2018-06-14 DIAGNOSIS — K219 Gastro-esophageal reflux disease without esophagitis: Secondary | ICD-10-CM | POA: Diagnosis not present

## 2018-06-14 DIAGNOSIS — M1990 Unspecified osteoarthritis, unspecified site: Secondary | ICD-10-CM | POA: Diagnosis not present

## 2018-06-14 DIAGNOSIS — K5730 Diverticulosis of large intestine without perforation or abscess without bleeding: Secondary | ICD-10-CM | POA: Diagnosis not present

## 2018-06-14 DIAGNOSIS — E119 Type 2 diabetes mellitus without complications: Secondary | ICD-10-CM | POA: Diagnosis not present

## 2018-06-14 DIAGNOSIS — J449 Chronic obstructive pulmonary disease, unspecified: Secondary | ICD-10-CM | POA: Insufficient documentation

## 2018-06-14 DIAGNOSIS — F329 Major depressive disorder, single episode, unspecified: Secondary | ICD-10-CM | POA: Diagnosis not present

## 2018-06-14 HISTORY — DX: Essential (primary) hypertension: I10

## 2018-06-14 HISTORY — DX: Type 2 diabetes mellitus without complications: E11.9

## 2018-06-14 HISTORY — DX: Depression, unspecified: F32.A

## 2018-06-14 HISTORY — PX: ESOPHAGOGASTRODUODENOSCOPY: SHX5428

## 2018-06-14 HISTORY — DX: Major depressive disorder, single episode, unspecified: F32.9

## 2018-06-14 HISTORY — DX: Unspecified glaucoma: H40.9

## 2018-06-14 HISTORY — DX: Gastritis, unspecified, without bleeding: K29.70

## 2018-06-14 HISTORY — DX: Chronic obstructive pulmonary disease, unspecified: J44.9

## 2018-06-14 HISTORY — DX: Unspecified osteoarthritis, unspecified site: M19.90

## 2018-06-14 HISTORY — PX: COLONOSCOPY WITH PROPOFOL: SHX5780

## 2018-06-14 SURGERY — EGD (ESOPHAGOGASTRODUODENOSCOPY)
Anesthesia: General

## 2018-06-14 MED ORDER — LIDOCAINE HCL (PF) 2 % IJ SOLN
INTRAMUSCULAR | Status: AC
  Filled 2018-06-14: qty 10

## 2018-06-14 MED ORDER — LIDOCAINE HCL (CARDIAC) PF 100 MG/5ML IV SOSY
PREFILLED_SYRINGE | INTRAVENOUS | Status: DC | PRN
  Administered 2018-06-14: 70 mg via INTRATRACHEAL

## 2018-06-14 MED ORDER — SODIUM CHLORIDE 0.9 % IV SOLN
INTRAVENOUS | Status: DC
  Administered 2018-06-14: 10:00:00 via INTRAVENOUS

## 2018-06-14 MED ORDER — PHENYLEPHRINE HCL 10 MG/ML IJ SOLN
INTRAMUSCULAR | Status: DC | PRN
  Administered 2018-06-14 (×2): 100 ug via INTRAVENOUS
  Administered 2018-06-14: 50 ug via INTRAVENOUS

## 2018-06-14 MED ORDER — GLYCOPYRROLATE 0.2 MG/ML IJ SOLN
INTRAMUSCULAR | Status: AC
  Filled 2018-06-14: qty 1

## 2018-06-14 MED ORDER — SODIUM CHLORIDE 0.9 % IV SOLN: INTRAVENOUS | Status: DC

## 2018-06-14 MED ORDER — PROPOFOL 500 MG/50ML IV EMUL
INTRAVENOUS | Status: AC
  Filled 2018-06-14: qty 50

## 2018-06-14 MED ORDER — PROPOFOL 500 MG/50ML IV EMUL
INTRAVENOUS | Status: DC | PRN
  Administered 2018-06-14: 100 ug/kg/min via INTRAVENOUS

## 2018-06-14 MED ORDER — GLYCOPYRROLATE 0.2 MG/ML IJ SOLN
INTRAMUSCULAR | Status: DC | PRN
  Administered 2018-06-14: 0.2 mg via INTRAVENOUS

## 2018-06-14 MED ORDER — PROPOFOL 10 MG/ML IV BOLUS
INTRAVENOUS | Status: DC | PRN
  Administered 2018-06-14: 30 mg via INTRAVENOUS
  Administered 2018-06-14: 20 mg via INTRAVENOUS
  Administered 2018-06-14: 50 mg via INTRAVENOUS
  Administered 2018-06-14: 20 mg via INTRAVENOUS
  Administered 2018-06-14: 30 mg via INTRAVENOUS

## 2018-06-14 NOTE — Op Note (Signed)
[facility] Gastroenterology Patient Name: Jonathan Cantrell Procedure Date: 06/14/2018 9:12 AM MRN: [id number] Account #: [id number] Date of Birth: 04/15/1940 Admit Type: Outpatient Age: 78 Room: [facility] ENDO ROOM 1 Gender: Male Note Status: Finalized Procedure:            Upper GI endoscopy Indications:          Generalized abdominal pain, Heartburn Providers:            Scot Jun, MD Medicines:            Propofol per Anesthesia Complications:        No immediate complications. Procedure:            Pre-Anesthesia Assessment:                       - After reviewing the risks and benefits, the patient                        was deemed in satisfactory condition to undergo the                        procedure.                       After obtaining informed consent, the endoscope was                        passed under direct vision. Throughout the procedure,                        the patient's blood pressure, pulse, and oxygen                        saturations were monitored continuously. The Endoscope                        was introduced through the mouth, and advanced to the                        second part of duodenum. The upper GI endoscopy was                        accomplished without difficulty. The patient tolerated                        the procedure well. Findings:      The esophagus was normal. GEJ 40cm.      The stomach was normal.      The examined duodenum was normal. Impression:           - Normal esophagus.                       - Normal stomach.                       - Normal examined duodenum.                       - No specimens collected. Recommendation:       - The findings and recommendations were discussed with  the patient's family. Scot Jun, MD 06/14/2018 9:43:39 AM This report has been signed electronically. Number of Addenda: 0 Note Initiated On: 06/14/2018 9:12 AM      [facility]

## 2018-06-14 NOTE — H&P (Signed)
Primary Care Physician:  Marisue Ivan, MD Primary Gastroenterologist:  Dr. Mechele Collin  Pre-Procedure History & Physical: HPI:  Jonathan Cantrell is a 78 y.o. male is here for [facility]czewski endoscopy and colonoscopy.   Past Medical History:  Diagnosis Date  . Arthritis   . Colon polyps    personal hx of  . COPD (chronic obstructive pulmonary disease) (HCC)   . Depression   . Diabetes mellitus without complication (HCC)   . Dyspepsia   . Family history of colon cancer   . Gastritis   . GERD (gastroesophageal reflux disease)   . Glaucoma   . Halitosis   . Hypertension     Past Surgical History:  Procedure Laterality Date  . COLONOSCOPY N/A 08/11/2014   Procedure: COLONOSCOPY;  Surgeon: Wallace Cullens, MD;  Location: [facility] ENDOSCOPY;  Service: Gastroenterology;  Laterality: N/A;  . COLONOSCOPY W/ BIOPSIES AND POLYPECTOMY    . ESOPHAGOGASTRODUODENOSCOPY N/A 08/11/2014   Procedure: ESOPHAGOGASTRODUODENOSCOPY (EGD);  Surgeon: Wallace Cullens, MD;  Location: [facility] ENDOSCOPY;  Service: Gastroenterology;  Laterality: N/A;  . lbp N/A   . mvp    . rhinitis      Prior to Admission medications   Medication Sig Start Date End Date Taking? Authorizing Provider  bismuth subsalicylate (PEPTO BISMOL) 262 MG chewable tablet Chew 524 mg by mouth as needed.   Yes [provider]  cholecalciferol (VITAMIN D) 400 UNITS TABS tablet Take 400 Units by mouth 2 (two) times daily.   Yes [provider]  dexlansoprazole (DEXILANT) 60 MG capsule Take 60 mg by mouth daily.   Yes [provider]  meloxicam (MOBIC) 15 MG tablet Take 15 mg by mouth daily.   Yes [provider]  umeclidinium-vilanterol (ANORO ELLIPTA) 62.5-25 MCG/INH AEPB Inhale 1 puff into the lungs daily.   Yes [provider]  vitamin B-12 (CYANOCOBALAMIN) 1000 MCG tablet Take 1,000 mcg by mouth daily.   Yes [provider]  Brinzolamide-Brimonidine 1-0.2 % SUSP Apply 1 drop to eye 2 (two) times daily.    [provider]    Allergies as of 04/23/2018  . (No Known Allergies)    History reviewed. No pertinent family history.  Social History   Socioeconomic History  . Marital status: Married    Spouse name: Not on file  . Number of children: Not on file  . Years of education: Not on file  . Highest education level: Not on file  Occupational History  . Not on file  Social Needs  . Financial resource strain: Not on file  . Food insecurity:    Worry: Not on file    Inability: Not on file  . Transportation needs:    Medical: Not on file    Non-medical: Not on file  Tobacco Use  . Smoking status: Never Smoker  . Smokeless tobacco: Never Used  Substance and Sexual Activity  . Alcohol use: Yes    Alcohol/week: 14.0 standard drinks    Types: 14 Shots of liquor per week  . Drug use: No  . Sexual activity: Not on file  Lifestyle  . Physical activity:    Days per week: Not on file    Minutes per session: Not on file  . Stress: Not on file  Relationships  . Social connections:    Talks on phone: Not on file    Gets together: Not on file    Attends religious service: Not on file    Active member of club  or organization: Not on file    Attends meetings of clubs or organizations: Not on file    Relationship status: Not on file  . Intimate partner violence:    Fear of current or ex partner: Not on file    Emotionally abused: Not on file    Physically abused: Not on file    Forced sexual activity: Not on file  Other Topics Concern  . Not on file  Social History Narrative  . Not on file    Review of Systems: See HPI, otherwise negative ROS  Physical Exam: BP (!) 161/75   Pulse 89   Temp (!) 96.2 F (35.7 C) (Tympanic)   Resp 18   Ht 5\' 7"  (1.702 m)   Wt 70.3 kg   SpO2 98%   BMI 24.28 kg/m  General:   Alert,  pleasant and cooperative in NAD Head:  Normocephalic and atraumatic. Neck:  Supple; no masses or thyromegaly. Lungs:  Clear throughout to auscultation.     Heart:  Regular rate and rhythm. Abdomen:  Soft, nontender and nondistended. Normal bowel sounds, without guarding, and without rebound.   Neurologic:  Alert and  oriented x4;  grossly normal neurologically.  Impression/Plan: Jonathan Cantrell is here for Varady endoscopy and colonoscopy to be performed for [facility] colon polyps and constipation., EGD for abdominal pain and heartburn.  Risks, benefits, limitations, and alternatives regarding  endoscopy and colonoscopy have been reviewed with the patient.  Questions have been answered.  All parties agreeable.   Lynnae Prude, MD  06/14/2018, 9:29 AM

## 2018-06-14 NOTE — Anesthesia Post-op Follow-up Note (Signed)
Anesthesia QCDR form completed.        

## 2018-06-14 NOTE — Transfer of Care (Signed)
Immediate Anesthesia Transfer of Care Note  Patient: Jonathan Cantrell  Procedure(s) Performed: ESOPHAGOGASTRODUODENOSCOPY (EGD) (N/A ) COLONOSCOPY WITH PROPOFOL (N/A )  Patient Location: Endoscopy Unit  Anesthesia Type:General  Level of Consciousness: drowsy  Airway & Oxygen Therapy: Patient Spontanous Breathing and Patient connected to nasal cannula oxygen  Post-op Assessment: Report given to RN and Post -op Vital signs reviewed and stable  Post vital signs: stable  Last Vitals:  Vitals Value Taken Time  BP 104/52 06/14/2018 10:01 AM  Temp    Pulse 80 06/14/2018 10:03 AM  Resp 16 06/14/2018 10:03 AM  SpO2 100 % 06/14/2018 10:03 AM  Vitals shown include unvalidated device data.  Last Pain:  Vitals:   06/14/18 1001  TempSrc:   PainSc: Asleep         Complications: No apparent anesthesia complications

## 2018-06-14 NOTE — Anesthesia Postprocedure Evaluation (Signed)
Anesthesia Post Note  Patient: Jonathan Cantrell  Procedure(s) Performed: ESOPHAGOGASTRODUODENOSCOPY (EGD) (N/A ) COLONOSCOPY WITH PROPOFOL (N/A )  Patient location during evaluation: Endoscopy Anesthesia Type: General Level of consciousness: awake and alert and oriented Pain management: pain level controlled Vital Signs Assessment: post-procedure vital signs reviewed and stable Respiratory status: spontaneous breathing Cardiovascular status: blood pressure returned to baseline Anesthetic complications: no     Last Vitals:  Vitals:   06/14/18 1011 06/14/18 1021  BP: 121/64 134/69  Pulse: 85 82  Resp: 17 13  Temp:    SpO2: 99% 98%    Last Pain:  Vitals:   06/14/18 1021  TempSrc:   PainSc: 0-No pain                 Evlyn Amason

## 2018-06-14 NOTE — Op Note (Addendum)
[facility] Gastroenterology Patient Name: Jonathan Cantrell Procedure Date: 06/14/2018 9:12 AM MRN: [id number] Account #: [id number] Date of Birth: [birth year] Admit Type: Outpatient Age: 78 Room: [facility] ENDO ROOM 1 Gender: Male Note Status: Finalized Procedure:            Colonoscopy Indications:          High risk colon cancer surveillance: Personal history                        of colonic polyps Providers:            Scot Jun, MD Medicines:            Propofol per Anesthesia Complications:        No immediate complications. Procedure:            Pre-Anesthesia Assessment:                       - After reviewing the risks and benefits, the patient                        was deemed in satisfactory condition to undergo the                        procedure.                       After obtaining informed consent, the colonoscope was                        passed under direct vision. Throughout the procedure,                        the patient's blood pressure, pulse, and oxygen                        saturations were monitored continuously. The                        Colonoscope was introduced through the anus and                        advanced to the the cecum, identified by appendiceal                        orifice and ileocecal valve. The colonoscopy was                        performed without difficulty. The patient tolerated the                        procedure well. The quality of the bowel preparation                        was excellent. Findings:      A few small-mouthed diverticula were found in the ascending colon.      Internal hemorrhoids were found during endoscopy. The hemorrhoids were       small and Grade I (internal hemorrhoids that do not prolapse).      The exam was otherwise without abnormality. Impression:           - Diverticulosis  in the ascending colon.                       - Internal hemorrhoids.                       - The examination  was otherwise normal.                       - No specimens collected. Recommendation:       - The findings and recommendations were discussed with                        the patient. Scot Jun, MD 06/14/2018 9:57:52 AM This report has been signed electronically. Number of Addenda: 0 Note Initiated On: 06/14/2018 9:12 AM Scope Withdrawal Time: 0 hours 5 minutes 12 seconds  Total Procedure Duration: 0 hours 8 minutes 7 seconds       [facility]

## 2018-06-14 NOTE — Anesthesia Preprocedure Evaluation (Signed)
Anesthesia Evaluation  Patient identified by MRN, date of birth, ID band Patient awake    Reviewed: Allergy & Precautions, NPO status , Patient's Chart, lab work & pertinent test results, reviewed documented beta blocker date and time   Airway Mallampati: II  TM Distance: >3 FB Neck ROM: Full    Dental  (+) Chipped   Pulmonary COPD,    Pulmonary exam normal        Cardiovascular hypertension, Normal cardiovascular exam     Neuro/Psych PSYCHIATRIC DISORDERS Depression negative neurological ROS     GI/Hepatic Neg liver ROS, GERD  ,  Endo/Other  diabetes  Renal/GU negative Renal ROS  negative genitourinary   Musculoskeletal  (+) Arthritis ,   Abdominal Normal abdominal exam  (+)   Peds negative pediatric ROS (+)  Hematology negative hematology ROS (+)   Anesthesia Other Findings Past Medical History: No date: Arthritis No date: Colon polyps     Comment:  personal hx of No date: COPD (chronic obstructive pulmonary disease) (HCC) No date: Depression No date: Diabetes mellitus without complication (HCC) No date: Dyspepsia No date: Family history of colon cancer No date: Gastritis No date: GERD (gastroesophageal reflux disease) No date: Glaucoma No date: Halitosis No date: Hypertension  Reproductive/Obstetrics                             Anesthesia Physical  Anesthesia Plan  ASA: III  Anesthesia Plan: General   Post-op Pain Management:    Induction: Intravenous  PONV Risk Score and Plan:   Airway Management Planned: Nasal Cannula  Additional Equipment:   Intra-op Plan:   Post-operative Plan:   Informed Consent: I have reviewed the patients History and Physical, chart, labs and discussed the procedure including the risks, benefits and alternatives for the proposed anesthesia with the patient or authorized representative who has indicated his/her understanding and  acceptance.     Dental advisory given  Plan Discussed with: CRNA and Surgeon  Anesthesia Plan Comments:         Anesthesia Quick Evaluation

## 2018-06-16 ENCOUNTER — Encounter: Payer: Self-pay | Admitting: Unknown Physician Specialty

## 2020-04-05 ENCOUNTER — Other Ambulatory Visit
Admission: RE | Admit: 2020-04-05 | Discharge: 2020-04-05 | Disposition: A | Discharge status: Home / Self Care | Payer: Medicare HMO | Source: Ambulatory Visit | Attending: Family Medicine | Admitting: Family Medicine

## 2020-04-05 DIAGNOSIS — R61 Generalized hyperhidrosis: Secondary | ICD-10-CM | POA: Diagnosis present

## 2020-04-05 DIAGNOSIS — R0602 Shortness of breath: Secondary | ICD-10-CM | POA: Insufficient documentation

## 2020-04-05 DIAGNOSIS — R0789 Other chest pain: Secondary | ICD-10-CM | POA: Diagnosis present

## 2020-04-05 LAB — FIBRIN DERIVATIVES D-DIMER (ARMC ONLY): Fibrin derivatives D-dimer (ARMC): 613.14 ng/mL (FEU) — ABNORMAL HIGH (ref 0.00–499.00)

## 2020-04-17 ENCOUNTER — Other Ambulatory Visit: Payer: Self-pay | Admitting: Family Medicine

## 2020-04-17 ENCOUNTER — Other Ambulatory Visit (HOSPITAL_COMMUNITY): Payer: Self-pay | Admitting: Family Medicine

## 2020-04-17 DIAGNOSIS — R0789 Other chest pain: Secondary | ICD-10-CM

## 2020-05-03 ENCOUNTER — Other Ambulatory Visit: Payer: Self-pay

## 2020-05-03 ENCOUNTER — Ambulatory Visit
Admission: RE | Admit: 2020-05-03 | Discharge: 2020-05-03 | Disposition: A | Discharge status: Home / Self Care | Payer: Medicare HMO | Source: Ambulatory Visit | Attending: Family Medicine | Admitting: Family Medicine

## 2020-05-03 DIAGNOSIS — R0789 Other chest pain: Secondary | ICD-10-CM | POA: Insufficient documentation

## 2020-05-16 ENCOUNTER — Other Ambulatory Visit: Payer: Self-pay | Admitting: Family Medicine

## 2020-05-16 DIAGNOSIS — R0789 Other chest pain: Secondary | ICD-10-CM

## 2020-05-16 DIAGNOSIS — J449 Chronic obstructive pulmonary disease, unspecified: Secondary | ICD-10-CM

## 2020-05-17 ENCOUNTER — Other Ambulatory Visit: Payer: Self-pay

## 2020-05-17 ENCOUNTER — Ambulatory Visit
Admission: RE | Admit: 2020-05-17 | Discharge: 2020-05-17 | Disposition: A | Discharge status: Home / Self Care | Payer: Medicare HMO | Source: Ambulatory Visit | Attending: Family Medicine | Admitting: Family Medicine

## 2020-05-17 DIAGNOSIS — J449 Chronic obstructive pulmonary disease, unspecified: Secondary | ICD-10-CM | POA: Diagnosis present

## 2020-05-17 DIAGNOSIS — R0789 Other chest pain: Secondary | ICD-10-CM | POA: Insufficient documentation

## 2020-05-17 LAB — POCT I-STAT CREATININE: Creatinine, Ser: 0.8 mg/dL (ref 0.61–1.24)

## 2020-05-17 MED ORDER — IOHEXOL 350 MG/ML SOLN
75.0000 mL | Freq: Once | INTRAVENOUS | Status: AC | PRN
  Administered 2020-05-17: 75 mL via INTRAVENOUS

## 2022-04-19 IMAGING — CT CT CHEST W/O CM
2 of 4 series · 15 of 36 positions shown, 18 images · non-contrast
Comparison: None.

CLINICAL DATA: Atypical chest pain for 6 months

EXAM:
CT CHEST WITHOUT CONTRAST
TECHNIQUE: Multidetector CT imaging of the chest was performed following the
standard protocol without IV contrast.

[Series 2: chest 2.00 · axial · 0.64mm/px · z∈[-1216,-906]mm · 12 of 185 slices shown, 15 images]
[im 15/185  mediastinal]
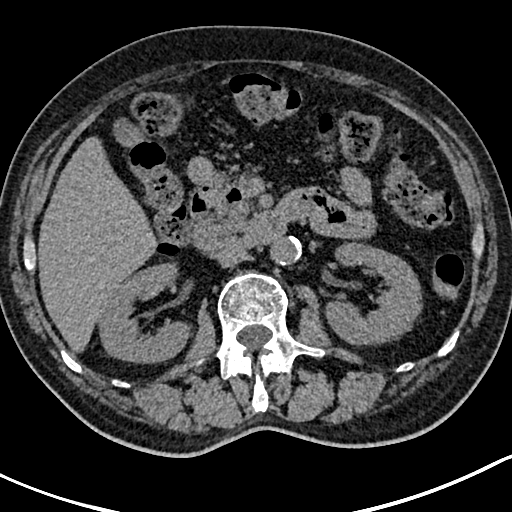
[im 15/185  lung]
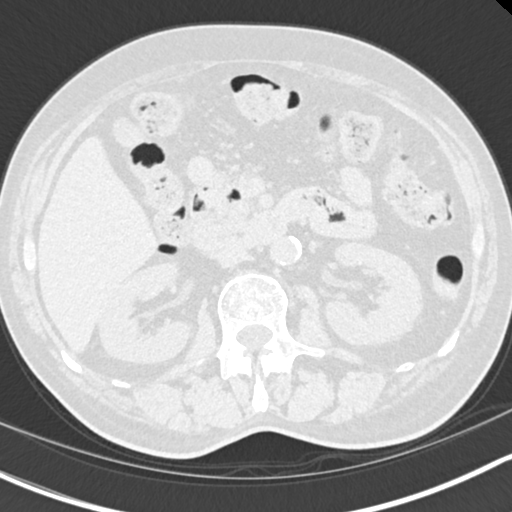
[im 29/185  lung]
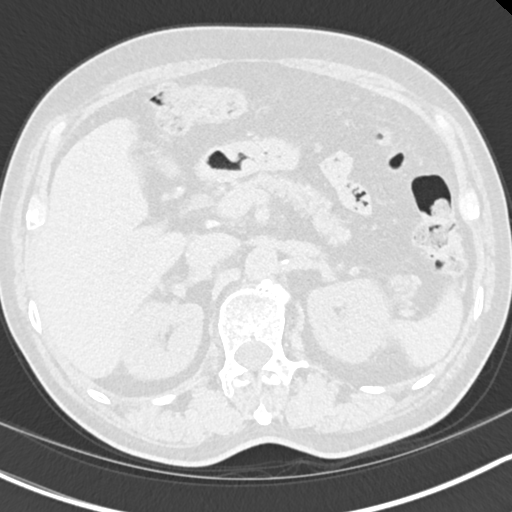
[im 43/185  lung]
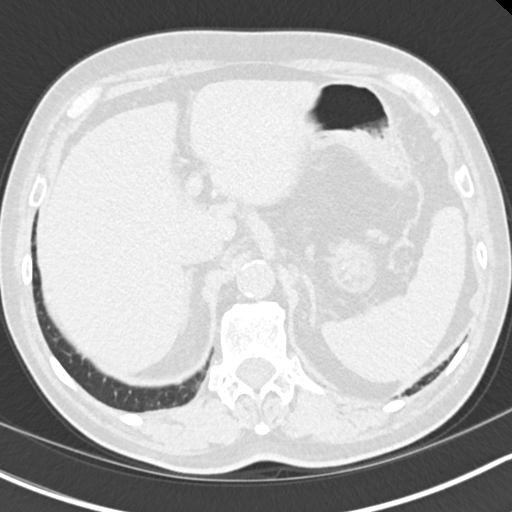
[im 57/185  lung]
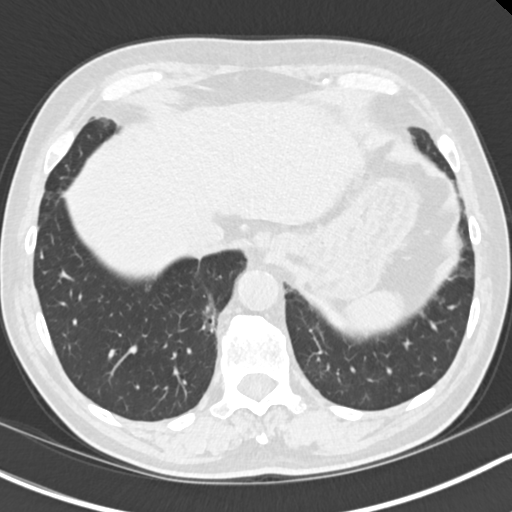
[im 71/185  mediastinal]
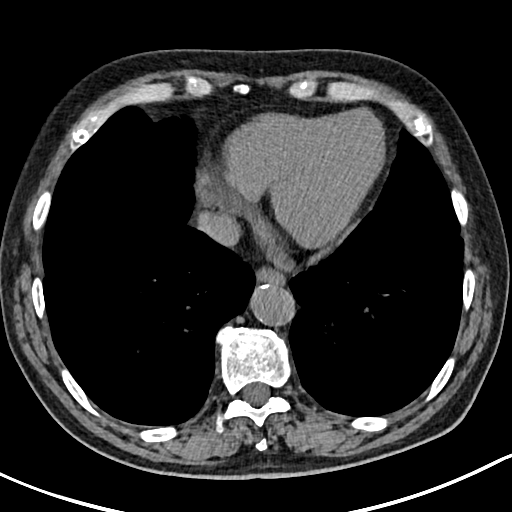
[im 71/185  lung]
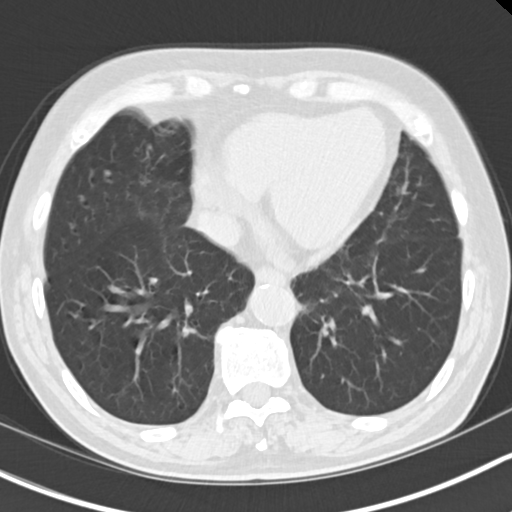
[im 85/185  lung]
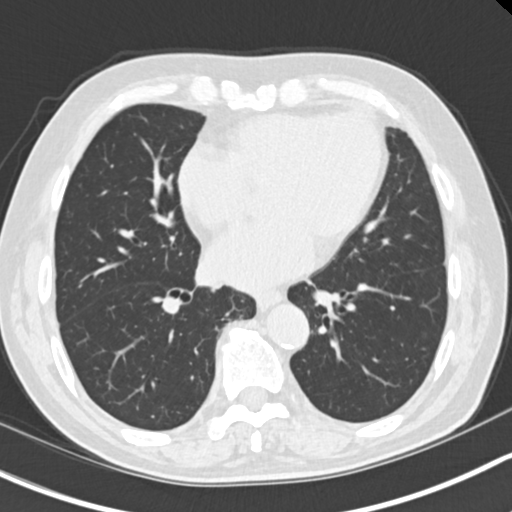
[im 100/185  lung]
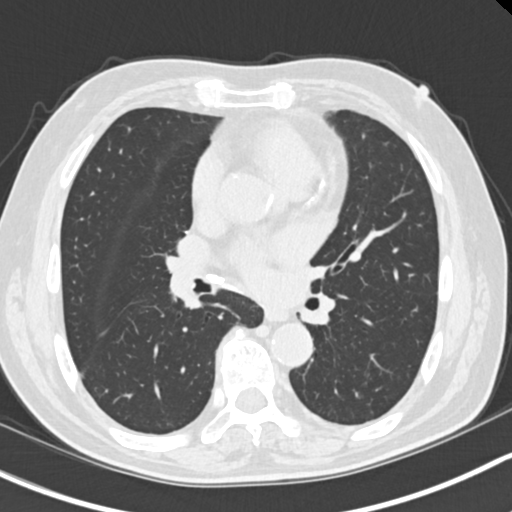
[im 114/185  lung]
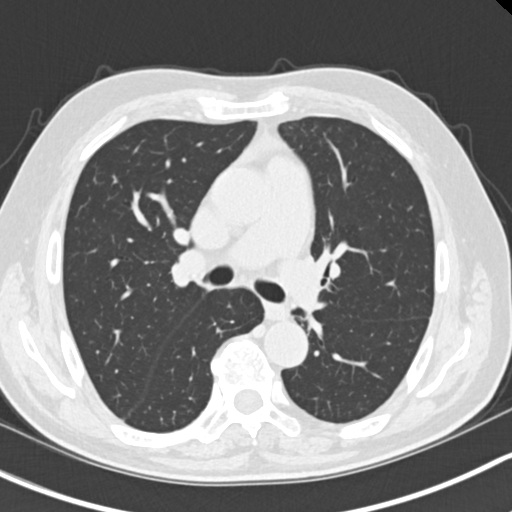
[im 128/185  mediastinal]
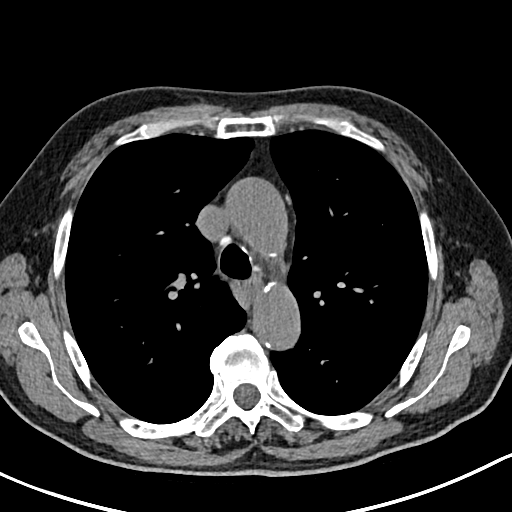
[im 128/185  lung]
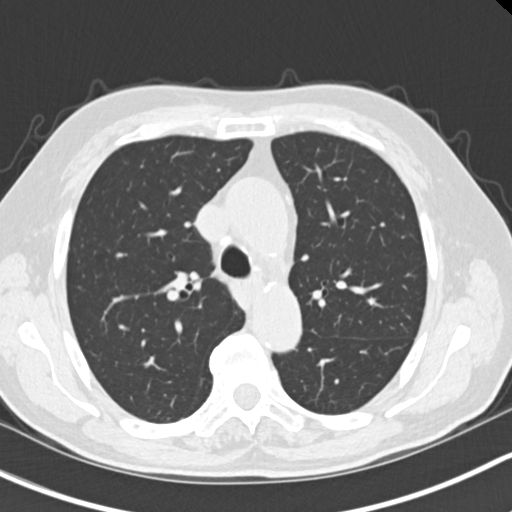
[im 142/185  lung]
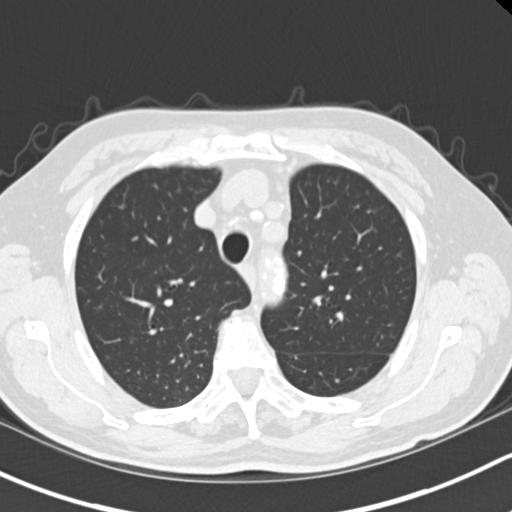
[im 156/185  lung]
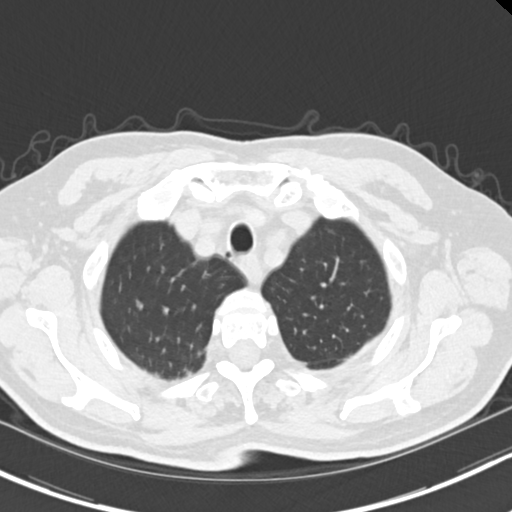
[im 170/185  lung]
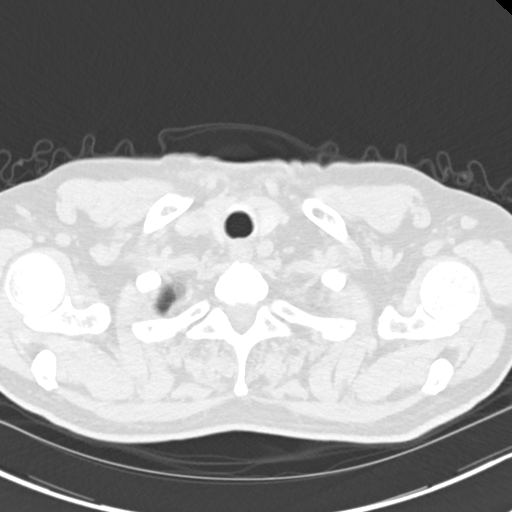

[Series 5: coronals chest 2.00 cor · coronal · 0.64mm/px · 3 of 148 slices shown]
[im 30/148  lung]
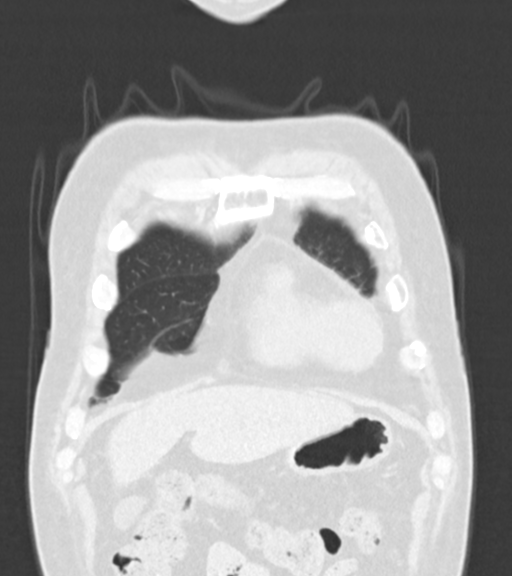
[im 59/148  lung]
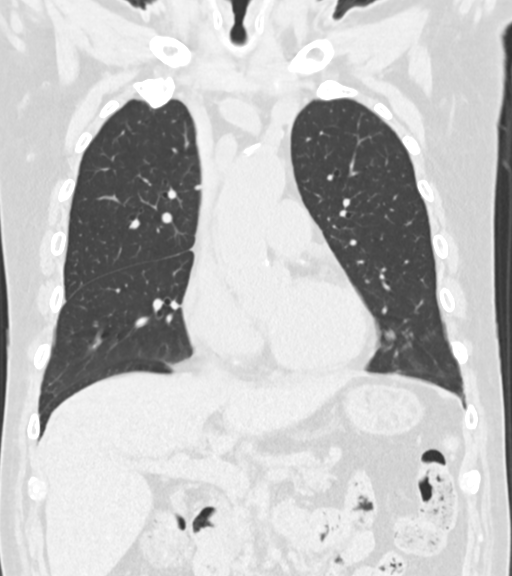
[im 89/148  lung]
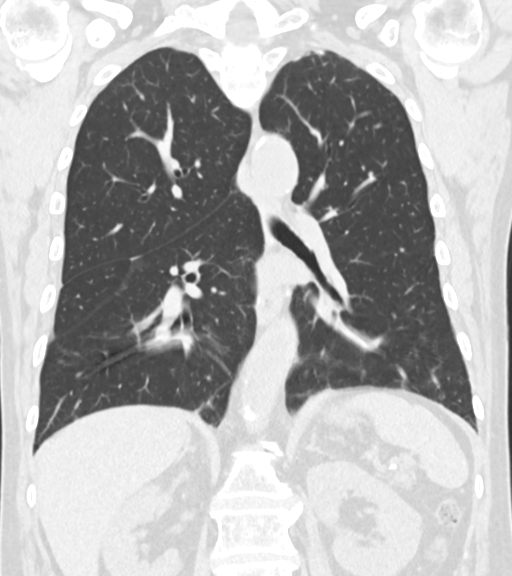

[15 of 36 positions shown; findings below may reference images not displayed]

FINDINGS: Cardiovascular: Heart is normal in size. Predominately LEFT-sided
coronary artery atherosclerotic calcifications. No significant
pericardial effusion. Moderate atherosclerotic calcifications
throughout the aorta. Mildly tortuous thoracic aorta is normal in
caliber.

Mediastinum/Nodes: Thyroid is unremarkable. No axillary adenopathy.
Scattered calcified mediastinal lymph nodes likely reflecting the
sequela of remote prior granulomatous infection.

Lungs/Pleura: No pleural effusion or pneumothorax. Mild bibasilar
scarring versus atelectasis. No suspicious pulmonary masses. Mild
LEFT greater than RIGHT apical pleuroparenchymal scarring.

Upper Abdomen: No acute abnormality.  Duodenal diverticulum.

Musculoskeletal: Mild degenerative changes of the thoracic spine.
Sclerotic area of the LEFT anterolateral third rib, likely a bone
island.
IMPRESSION: 1. No acute abnormality of the chest.
2. Please note that a noncontrast chest CT cannot evaluate for
pulmonary embolism. If persistent concern for pulmonary embolism,
consider dedicated V/Q scan or CTA PE study.

Aortic Atherosclerosis (3Y03E-WXR.R).

## 2022-10-15 ENCOUNTER — Emergency Department
Admission: EM | Admit: 2022-10-15 | Discharge: 2022-10-15 | Disposition: A | Discharge status: Home / Self Care | Payer: 59 | Attending: Emergency Medicine | Admitting: Emergency Medicine

## 2022-10-15 ENCOUNTER — Other Ambulatory Visit: Payer: Self-pay

## 2022-10-15 ENCOUNTER — Encounter: Payer: Self-pay | Admitting: Emergency Medicine

## 2022-10-15 ENCOUNTER — Emergency Department: Payer: 59

## 2022-10-15 DIAGNOSIS — I10 Essential (primary) hypertension: Secondary | ICD-10-CM | POA: Diagnosis not present

## 2022-10-15 DIAGNOSIS — J449 Chronic obstructive pulmonary disease, unspecified: Secondary | ICD-10-CM | POA: Insufficient documentation

## 2022-10-15 DIAGNOSIS — E119 Type 2 diabetes mellitus without complications: Secondary | ICD-10-CM | POA: Diagnosis not present

## 2022-10-15 DIAGNOSIS — R42 Dizziness and giddiness: Secondary | ICD-10-CM | POA: Insufficient documentation

## 2022-10-15 DIAGNOSIS — R0789 Other chest pain: Secondary | ICD-10-CM | POA: Diagnosis not present

## 2022-10-15 DIAGNOSIS — R072 Precordial pain: Secondary | ICD-10-CM | POA: Diagnosis present

## 2022-10-15 LAB — CBC
HCT: 42.9 % (ref 39.0–52.0)
Hemoglobin: 14.6 g/dL (ref 13.0–17.0)
MCH: 31.3 pg (ref 26.0–34.0)
MCHC: 34 g/dL (ref 30.0–36.0)
MCV: 92.1 fL (ref 80.0–100.0)
Platelets: 180 10*3/uL (ref 150–400)
RBC: 4.66 MIL/uL (ref 4.22–5.81)
RDW: 13.6 % (ref 11.5–15.5)
WBC: 4.1 10*3/uL (ref 4.0–10.5)
nRBC: 0 % (ref 0.0–0.2)

## 2022-10-15 LAB — BASIC METABOLIC PANEL
Anion gap: 8 (ref 5–15)
BUN: 20 mg/dL (ref 8–23)
CO2: 25 mmol/L (ref 22–32)
Calcium: 8.7 mg/dL — ABNORMAL LOW (ref 8.9–10.3)
Chloride: 104 mmol/L (ref 98–111)
Creatinine, Ser: 0.68 mg/dL (ref 0.61–1.24)
GFR, Estimated: >60 mL/min (ref >60)
Glucose, Bld: 128 mg/dL — ABNORMAL HIGH (ref 70–99)
Potassium: 4.1 mmol/L (ref 3.5–5.1)
Sodium: 137 mmol/L (ref 135–145)

## 2022-10-15 LAB — HEPATIC FUNCTION PANEL
ALT: 15 U/L (ref 0–44)
AST: 23 U/L (ref 15–41)
Albumin: 3.9 g/dL (ref 3.5–5.0)
Alkaline Phosphatase: 42 U/L (ref 38–126)
Bilirubin, Direct: 0.1 mg/dL (ref 0.0–0.2)
Indirect Bilirubin: 0.8 mg/dL (ref 0.3–0.9)
Total Bilirubin: 0.9 mg/dL (ref 0.3–1.2)
Total Protein: 6.8 g/dL (ref 6.5–8.1)

## 2022-10-15 LAB — MAGNESIUM: Magnesium: 2.3 mg/dL (ref 1.7–2.4)

## 2022-10-15 LAB — LIPASE, BLOOD: Lipase: 34 U/L (ref 11–51)

## 2022-10-15 LAB — TROPONIN I (HIGH SENSITIVITY)
Troponin I (High Sensitivity): 8 ng/L (ref <18)
Troponin I (High Sensitivity): 9 ng/L (ref <18)

## 2022-10-15 NOTE — ED Provider Notes (Signed)
[facility] Provider Note    Event Date/Time   First MD Initiated Contact with Patient 10/15/22 1117     (approximate)   History   Chest Pain and Dizziness   HPI  Jonathan Cantrell is a 82 y.o. male   Past medical history of COPD, diabetes, GERD, hypertension who presents emergency department with chest pain.  He woke this morning feeling well and while at rest this morning developed substernal/left-sided chest pain without radiation and is nonexertional.  Last about 20 seconds and subsided.  He was concerned because this lasted more than his normal occasional chest pain dating back many years which is in the same location quality but usually last for only few seconds at a time.  He is no longer in chest pain now.  He is otherwise been in his regular state of health no recent illnesses.  He has been taking all of his medications as prescribed.  Specifically denies shortness of breath, cough, fever or GI or GU complaints.  He also mentions that he has occasional headaches and dizziness but none currently.  The imbalance/dizziness usually worse right out of bed in the morning and then throughout the day gets better.  This has been ongoing for many years and is unchanged.  He clarifies that today's visit the emergency department is to address his longer than usual chest pain.   External Medical Documents Reviewed: Office visit from internal medicine dated 09/15/2022 which addresses his imbalance/dizziness      Physical Exam   Triage Vital Signs: ED Triage Vitals  Enc Vitals Group     BP 10/15/22 1051 (!) 164/79     Pulse Rate 10/15/22 1051 74     Resp 10/15/22 1051 18     Temp 10/15/22 1051 98.1 F (36.7 C)     Temp Source 10/15/22 1051 Oral     SpO2 10/15/22 1051 96 %     Weight 10/15/22 1052 160 lb 3.2 oz (72.7 kg)     Height 10/15/22 1052 5\' 5"  (1.651 m)     Head Circumference --      Peak Flow --      Pain Score 10/15/22 1051 0     Pain Loc --      Pain  Edu? --      Excl. in GC? --     Most recent vital signs: Vitals:   10/15/22 1051  BP: (!) 164/79  Pulse: 74  Resp: 18  Temp: 98.1 F (36.7 C)  SpO2: 96%    General: Awake, no distress.  CV:  Good peripheral perfusion. Resp:  Normal effort.  Abd:  No distention.  Other:  Optimal pleasant gentleman with hypertension otherwise vital signs within normal limits.  No respiratory distress and clear lungs to auscultation, soft nontender abdomen, appears euvolemic.  No obvious murmurs on heart exam.  No obvious focal deficits including facial asymmetry dysarthria or motor or sensory deficits bilateral upper and lower extremity   ED Results / Procedures / Treatments   Labs (all labs ordered are listed, but only abnormal results are displayed) Labs Reviewed  BASIC METABOLIC PANEL - Abnormal; Notable for the following components:      Result Value   Glucose, Bld 128 (*)    Calcium 8.7 (*)    All other components within normal limits  CBC  HEPATIC FUNCTION PANEL  LIPASE, BLOOD  MAGNESIUM  TROPONIN I (HIGH SENSITIVITY)     I ordered and reviewed the above  labs they are notable for electrolytes within normal limits creatinine normal initial troponin is 8 and cell counts are normal.  EKG  ED ECG REPORT I, Pilar Jarvis, the attending physician, personally viewed and interpreted this ECG.   Date: 10/15/2022  EKG Time: 1043  Rate: 80  Rhythm: nsr  Axis: nl  Intervals:none  ST&T Change: no stemi    RADIOLOGY I independently reviewed and interpreted CT scan of the head and see no obvious bleeding or midline shift   PROCEDURES:  Critical Care performed: No  Procedures   MEDICATIONS ORDERED IN ED: Medications - No data to display  IMPRESSION / MDM / ASSESSMENT AND PLAN / ED COURSE  I reviewed the triage vital signs and the nursing notes.                                Patient's presentation is most consistent with acute presentation with potential threat to life  or bodily function.  Differential diagnosis includes, but is not limited to, ACS, PE, dissection, GERD or gastritis.  For his chronic intermittent dizziness and headache I considered but he is having a CVA or ICH   The patient is on the cardiac monitor to evaluate for evidence of arrhythmia and/or significant heart rate changes.  MDM:    For his intermittent chest pain this would be very atypical for ACS, PE, dissection or other cardiopulmonary emergencies given its quality and transient nature.  He is comfortable now.  Given his risk factors and age we will check for cardiac ischemia with Donaghy EKG which looks like no STEMI, and follow-up with serial troponins.  I considered but I doubt PE or dissection given the quality of his symptoms do not match these diagnoses.  For his chronic intermittent headaches and dizziness I doubt this is neurologic emergency like ICH or CVA, given no focal neurologic deficits today and a normal CT scan of the head and currently asymptomatic.  I do not think he is having a COPD exacerbation or respiratory infection given no wheezing on auscultation, no WBC increase, clear chest x-ray.  Thus far workup unremarkable patient stable and asymptomatic, pending follow-up troponin flat plan will be for discharge and cardiology referral.        FINAL CLINICAL IMPRESSION(S) / ED DIAGNOSES   Final diagnoses:  Atypical chest pain     Rx / DC Orders   ED Discharge Orders          Ordered    Ambulatory referral to Cardiology       Comments: If you have not heard from the Cardiology office within the next 72 hours please call [phone].   10/15/22 1221             Note:  This document was prepared using Dragon voice recognition software and may include unintentional dictation errors.    Pilar Jarvis, MD 10/15/22 [phone]

## 2022-10-15 NOTE — ED Triage Notes (Signed)
Pt to ED from [facility] for multiple medical complaints. Pt states that he was having chest pain this morning. Pt states that this happens intermittently over the past 3 years. Pt also states that he has been feeling dizzy and off balance at times, but is not currently dizzy. Pt reports having intermittent pain in the left side of his head. Pt also reports that his blood pressure at home has been higher than normal. Pt has equal sensation on both sides, no facial droop, and no slurred speech.

## 2022-10-15 NOTE — Discharge Instructions (Addendum)
Fortunately your testing in the emergency department did not show any emergency conditions like heart attack today.  I made a referral to a cardiologist who should call you to schedule follow-up appointment for further testing as needed.  Follow your primary doctor regarding your blood pressure management.  Thank you for choosing [place] for your health care today!  Please see your primary doctor this week for a follow up appointment.   If you have any new, worsening, or unexpected symptoms call your doctor right away or come back to the emergency department for reevaluation.  It was my pleasure to care for you today.   Daneil Dan [place]esto Charon, MD

## 2022-12-19 ENCOUNTER — Ambulatory Visit: Payer: 59 | Admitting: Cardiology

## 2023-06-22 ENCOUNTER — Emergency Department

## 2023-06-22 ENCOUNTER — Encounter: Payer: Self-pay | Admitting: Emergency Medicine

## 2023-06-22 ENCOUNTER — Emergency Department
Admission: EM | Admit: 2023-06-22 | Discharge: 2023-06-22 | Disposition: A | Discharge status: Home / Self Care | Attending: Emergency Medicine | Admitting: Emergency Medicine

## 2023-06-22 ENCOUNTER — Other Ambulatory Visit: Payer: Self-pay

## 2023-06-22 DIAGNOSIS — W19XXXA Unspecified fall, initial encounter: Secondary | ICD-10-CM | POA: Insufficient documentation

## 2023-06-22 DIAGNOSIS — J449 Chronic obstructive pulmonary disease, unspecified: Secondary | ICD-10-CM | POA: Diagnosis not present

## 2023-06-22 DIAGNOSIS — E119 Type 2 diabetes mellitus without complications: Secondary | ICD-10-CM | POA: Insufficient documentation

## 2023-06-22 DIAGNOSIS — I10 Essential (primary) hypertension: Secondary | ICD-10-CM | POA: Insufficient documentation

## 2023-06-22 DIAGNOSIS — M79605 Pain in left leg: Secondary | ICD-10-CM | POA: Diagnosis present

## 2023-06-22 LAB — CBC
HCT: 45 % (ref 39.0–52.0)
Hemoglobin: 14.8 g/dL (ref 13.0–17.0)
MCH: 31.3 pg (ref 26.0–34.0)
MCHC: 32.9 g/dL (ref 30.0–36.0)
MCV: 95.1 fL (ref 80.0–100.0)
Platelets: 154 10*3/uL (ref 150–400)
RBC: 4.73 MIL/uL (ref 4.22–5.81)
RDW: 13.4 % (ref 11.5–15.5)
WBC: 2.9 10*3/uL — ABNORMAL LOW (ref 4.0–10.5)
nRBC: 0 % (ref 0.0–0.2)

## 2023-06-22 LAB — COMPREHENSIVE METABOLIC PANEL
ALT: 21 U/L (ref 0–44)
AST: 24 U/L (ref 15–41)
Albumin: 3.9 g/dL (ref 3.5–5.0)
Alkaline Phosphatase: 31 U/L — ABNORMAL LOW (ref 38–126)
Anion gap: 9 (ref 5–15)
BUN: 17 mg/dL (ref 8–23)
CO2: 24 mmol/L (ref 22–32)
Calcium: 8.9 mg/dL (ref 8.9–10.3)
Chloride: 106 mmol/L (ref 98–111)
Creatinine, Ser: 0.59 mg/dL — ABNORMAL LOW (ref 0.61–1.24)
GFR, Estimated: >60 mL/min (ref >60)
Glucose, Bld: 107 mg/dL — ABNORMAL HIGH (ref 70–99)
Potassium: 4.4 mmol/L (ref 3.5–5.1)
Sodium: 139 mmol/L (ref 135–145)
Total Bilirubin: 1.3 mg/dL — ABNORMAL HIGH (ref 0.0–1.2)
Total Protein: 6.8 g/dL (ref 6.5–8.1)

## 2023-06-22 LAB — CK: Total CK: 56 U/L (ref 49–397)

## 2023-06-22 NOTE — ED Triage Notes (Signed)
 Pt here with bilateral leg weakness since x2 weeks. Pt still having pain in both legs. Pt states sometimes he walks "drunk". Pt denies NVD.

## 2023-06-22 NOTE — ED Provider Notes (Addendum)
 [facility] Provider Note    Event Date/Time   First MD Initiated Contact with Patient 06/22/23 1341     (approximate)  [place] interpreter used for this evaluation, patient does speak some English as well.  History   Chief Complaint: Leg Pain  HPI  Jonathan Cantrell is a 83 y.o. male with a past medical history of COPD, diabetes, depression, hypertension, presents to the emergency department for left leg pain and some trouble walking.  According to the patient 2 weeks ago he was taking some trash out when a cat startled him causing him to fall onto his buttocks.  Patient states since that time he has been experiencing some pain in the left thigh area and feels like at times his leg is buckling due to the discomfort.  Patient denies any weakness in the leg denies any numbness in the leg.  Denies any upper extremity symptoms any speech difficulty.  Patient denies any head injury at any point.  Patient states that symptoms have been ongoing since the fall 2 weeks ago.  Patient states no worsening today he went to the clinic to get it checked out and was referred to the emergency department for further evaluation.  Physical Exam   Triage Vital Signs: ED Triage Vitals [06/22/23 1130]  Encounter Vitals Group     BP (!) 151/112     Systolic BP Percentile      Diastolic BP Percentile      Pulse Rate 82     Resp 18     Temp 98.5 F (36.9 C)     Temp Source Oral     SpO2 99 %     Weight 160 lb 4.4 oz (72.7 kg)     Height 5\' 5"  (1.651 m)     Head Circumference      Peak Flow      Pain Score 5     Pain Loc      Pain Education      Exclude from Growth Chart     Most recent vital signs: Vitals:   06/22/23 1130  BP: (!) 151/112  Pulse: 82  Resp: 18  Temp: 98.5 F (36.9 C)  SpO2: 99%    General: Awake, no distress.  CV:  Good peripheral perfusion.  Regular rate and rhythm  Resp:  Normal effort.  Equal breath sounds bilaterally.  Abd:  No distention.  Soft,  nontender.  No rebound or guarding. Other:  Equal grip strength bilaterally.  No drift.  Cranial nerves intact.  5/5 strength in all extremities including bilateral lower extremities.   ED Results / Procedures / Treatments   RADIOLOGY  Ultrasound negative   MEDICATIONS ORDERED IN ED: Medications - No data to display   IMPRESSION / MDM / ASSESSMENT AND PLAN / ED COURSE  I reviewed the triage vital signs and the nursing notes.  Patient's presentation is most consistent with acute presentation with potential threat to life or bodily function.  Patient presents the emergency department for left leg pain for the last 2 weeks since a fall.  Patient has been ambulatory without issue but does state at times it feels like the leg will give out due to discomfort.  There is no appreciable swelling.  Given 2 weeks of symptoms however we will obtain Sok ultrasound to further evaluate.  Patient's lab work is reassuring, no concerning findings on CBC, chemistry is normal and patient CK is normal.  Discussed with the patient as  long as the ultrasound does not appear to show any significant finding I believe the patient will be safe for discharge home with outpatient follow-up with his PCP.  Patient agreeable to plan of care.  Reassuring neurological examination, no concern for CVA.  Ultrasound is negative.  Patient reassured.  Will discharge home with outpatient follow-up FINAL CLINICAL IMPRESSION(S) / ED DIAGNOSES   Fall Left leg pain   Note:  This document was prepared using Dragon voice recognition software and may include unintentional dictation errors.   Minna Antis, MD 06/22/23 1446    Minna Antis, MD 06/22/23 1540

## 2023-06-22 NOTE — ED Notes (Signed)
 [employer]esy car called for ride to patient's car at this time.

## 2023-06-22 NOTE — Discharge Instructions (Addendum)
 Your workup in the emergency department has shown normal results.  Please follow-up with your primary care doctor within the next several days for recheck/reevaluation.  Return to the emergency department for any symptom personally concerning to yourself.

## 2024-03-15 ENCOUNTER — Other Ambulatory Visit: Payer: Self-pay | Admitting: Sports Medicine

## 2024-03-15 DIAGNOSIS — G8929 Other chronic pain: Secondary | ICD-10-CM

## 2024-03-15 DIAGNOSIS — M47816 Spondylosis without myelopathy or radiculopathy, lumbar region: Secondary | ICD-10-CM

## 2024-03-15 DIAGNOSIS — S32010A Wedge compression fracture of first lumbar vertebra, initial encounter for closed fracture: Secondary | ICD-10-CM

## 2024-03-15 DIAGNOSIS — M51362 Other intervertebral disc degeneration, lumbar region with discogenic back pain and lower extremity pain: Secondary | ICD-10-CM

## 2024-03-17 ENCOUNTER — Ambulatory Visit: Admission: RE | Admit: 2024-03-17 | Discharge: 2024-03-17 | Attending: Sports Medicine

## 2024-03-17 DIAGNOSIS — M5442 Lumbago with sciatica, left side: Secondary | ICD-10-CM | POA: Insufficient documentation

## 2024-03-17 DIAGNOSIS — G8929 Other chronic pain: Secondary | ICD-10-CM | POA: Insufficient documentation

## 2024-03-17 DIAGNOSIS — S32010A Wedge compression fracture of first lumbar vertebra, initial encounter for closed fracture: Secondary | ICD-10-CM | POA: Diagnosis present

## 2024-03-17 DIAGNOSIS — M5441 Lumbago with sciatica, right side: Secondary | ICD-10-CM | POA: Diagnosis present

## 2024-03-17 DIAGNOSIS — M51362 Other intervertebral disc degeneration, lumbar region with discogenic back pain and lower extremity pain: Secondary | ICD-10-CM | POA: Diagnosis present

## 2024-03-17 DIAGNOSIS — M47816 Spondylosis without myelopathy or radiculopathy, lumbar region: Secondary | ICD-10-CM | POA: Diagnosis present
